# Patient Record
Sex: Female | Born: 2000 | Race: White | Hispanic: No | Marital: Single | Smoking: Never smoker
Health system: Southern US, Community
[De-identification: ages and names within clinical notes are randomized; demographics above are authoritative.]

## PROBLEM LIST (undated history)

## (undated) ENCOUNTER — Inpatient Hospital Stay (HOSPITAL_COMMUNITY): Payer: Self-pay

## (undated) ENCOUNTER — Inpatient Hospital Stay (HOSPITAL_COMMUNITY)

## (undated) DIAGNOSIS — F509 Eating disorder, unspecified: Principal | ICD-10-CM

## (undated) DIAGNOSIS — G43909 Migraine, unspecified, not intractable, without status migrainosus: Secondary | ICD-10-CM

## (undated) DIAGNOSIS — I10 Essential (primary) hypertension: Secondary | ICD-10-CM

## (undated) DIAGNOSIS — O139 Gestational [pregnancy-induced] hypertension without significant proteinuria, unspecified trimester: Secondary | ICD-10-CM

## (undated) DIAGNOSIS — F419 Anxiety disorder, unspecified: Secondary | ICD-10-CM

## (undated) HISTORY — PX: DENTAL SURGERY: SHX609

## (undated) HISTORY — DX: Gestational (pregnancy-induced) hypertension without significant proteinuria, unspecified trimester: O13.9

## (undated) HISTORY — DX: Eating disorder, unspecified: F50.9

## (undated) HISTORY — DX: Essential (primary) hypertension: I10

---
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
## 2019-01-02 ENCOUNTER — Telehealth: Payer: Self-pay | Admitting: Obstetrics & Gynecology

## 2019-01-02 NOTE — Telephone Encounter (Signed)
LVM to CB and reschedule missed appointment. °

## 2019-01-03 NOTE — Procedures (Signed)
Post-Placental IUD Insertion Procedure Note LATE ENTRY   Patient identified, informed consent signed prior to delivery, signed copy in chart, time out was performed.    Dr. Debroah Loop performed the insertion.   Vaginal, labial and perineal areas thoroughly inspected for lacerations. Superficial 1st degree laceration identified - not hemostatic,repaired  After IUD insertion.  Mirena/Liletta/Skyla/Paragard/Kyleena    - IUD inserted with inserter per manufacturer's instructions.    Strings trimmed to the level of the introitus. Patient tolerated procedure well.  The Lot Number: [id number]  Expiration Date 02/2022   Patient given post procedure instructions and IUD care card with expiration date.  [name] asked to keep IUD strings tucked in her vagina until her postpartum follow up visit in 4-6 weeks. Patient advised to abstain from sexual intercourse and pulling on strings before her follow-up visit. Patient verbalized an understanding of the plan of care and agrees.

## 2019-01-05 ENCOUNTER — Ambulatory Visit: Payer: Medicaid Other

## 2019-01-05 VITALS — BP 121/82 | HR 111 | Wt 123.4 lb

## 2019-01-05 DIAGNOSIS — Z0130 Encounter for examination of blood pressure without abnormal findings: Secondary | ICD-10-CM

## 2019-01-05 NOTE — Progress Notes (Signed)
Pt is here for PP BP check. She delivered on 12/22/2018. Pt is prescribed Amlodipine 5mg  daily for hypertension. BP today within normal limits and pt denies any symptoms. Advised by Steward Drone CNM to keep patient on current meds until her post partum appt on 01/19/19. Pt verbalized understanding.

## 2019-01-19 ENCOUNTER — Ambulatory Visit: Payer: Medicaid Other | Admitting: Obstetrics & Gynecology

## 2019-01-19 NOTE — Progress Notes (Deleted)
   Patient did not show up today for her scheduled appointment.   Vallen Calabrese, MD, FACOG Obstetrician & Gynecologist, [facility], [facility]  

## 2019-02-01 ENCOUNTER — Ambulatory Visit: Payer: Medicaid Other

## 2019-02-02 ENCOUNTER — Telehealth: Payer: Self-pay | Admitting: *Deleted

## 2019-02-02 NOTE — Telephone Encounter (Signed)
I called patient in regards to a missed appt no answer left voicemail.

## 2019-07-15 IMAGING — US US MFM OB DETAIL+14 WK
1 series · 14 of 28 positions shown · non-contrast
Comparison: none

[Series 1: us mfm ob detail+14 wk · 76 acquisitions, 14 frames shown]
[im 3/76]
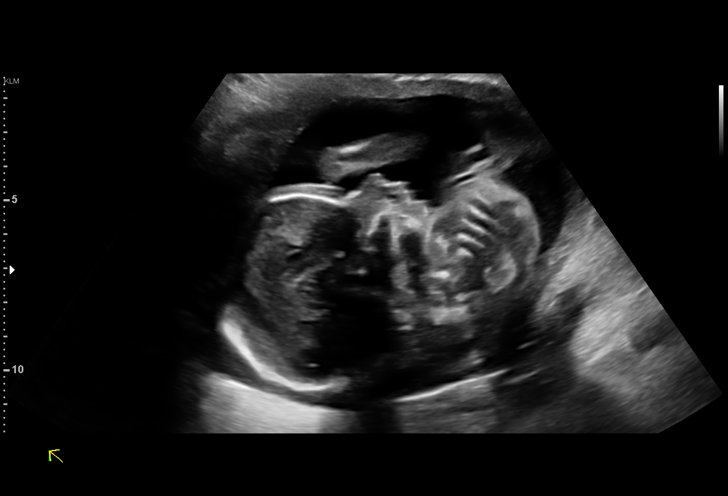
[im 9/76]
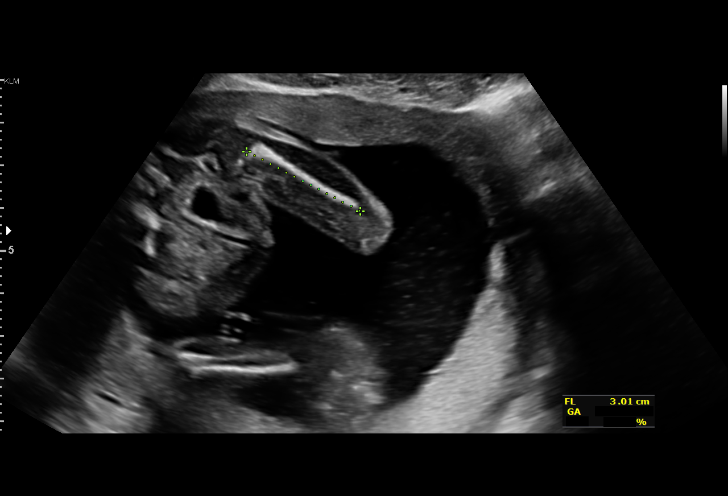
[im 14/76]
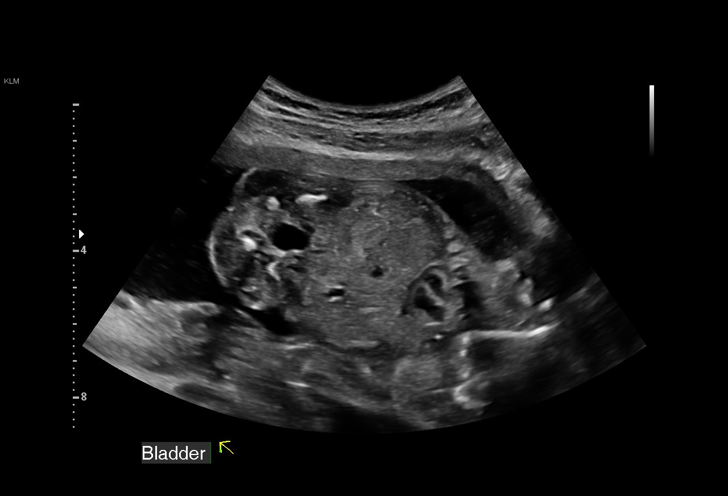
[im 20/76]
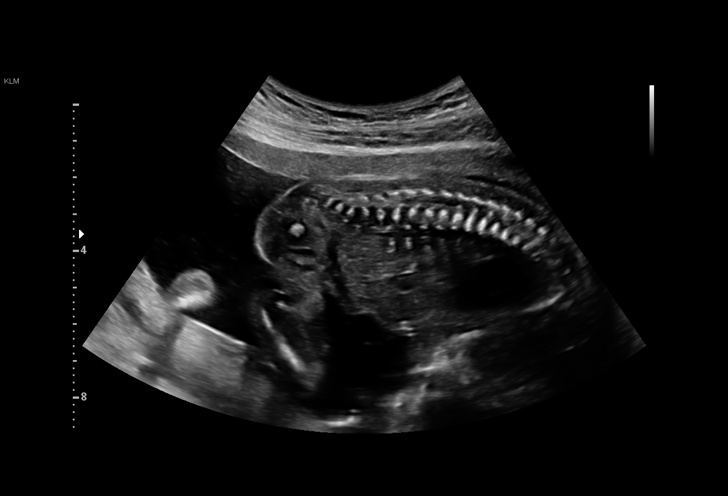
[im 26/76]
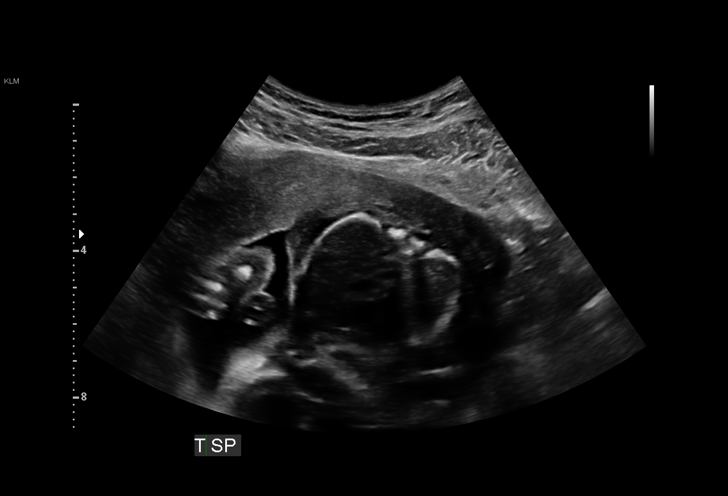
[im 31/76]
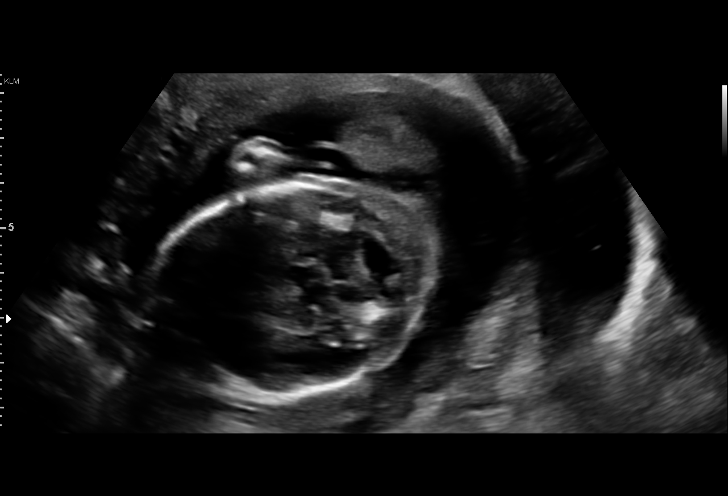
[im 37/76]
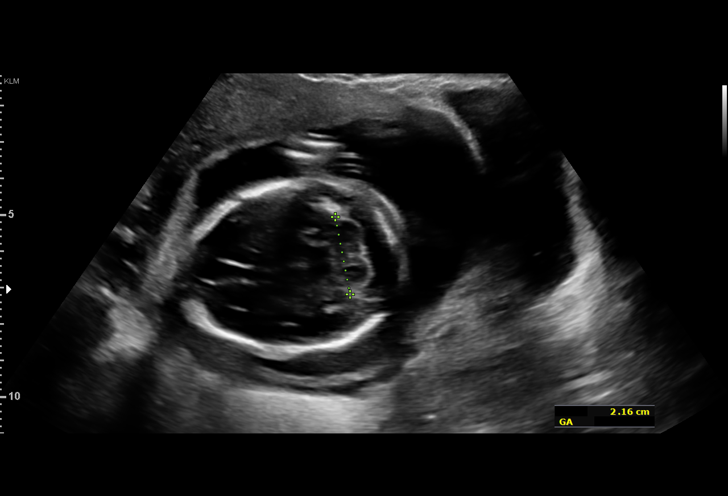
[im 42/76]
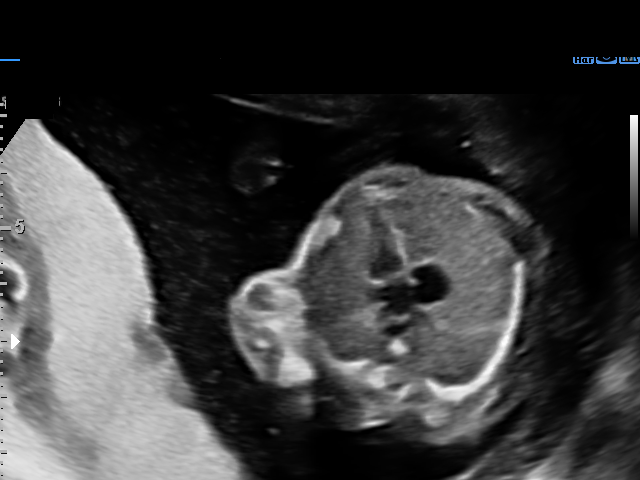
[im 48/76]
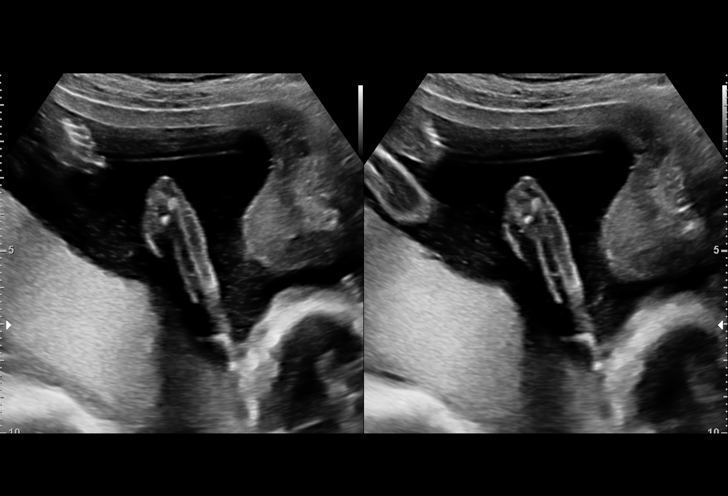
[im 53/76]
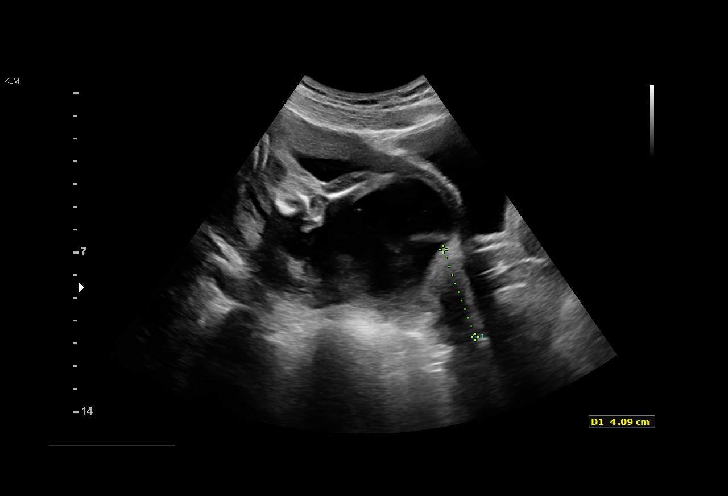
[im 59/76]
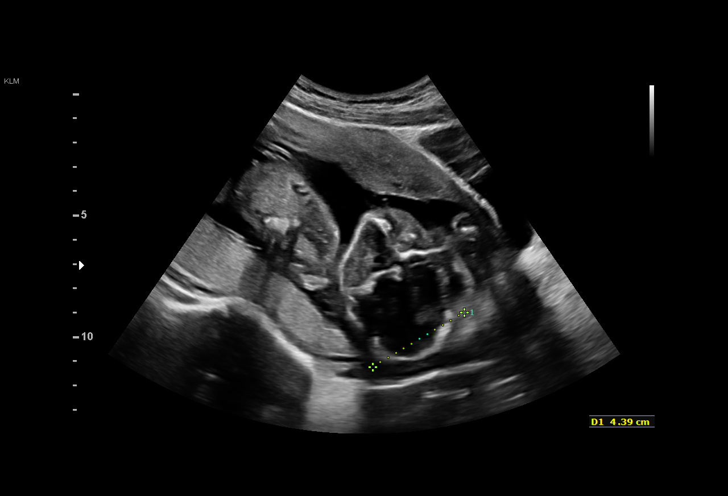
[im 64/76]
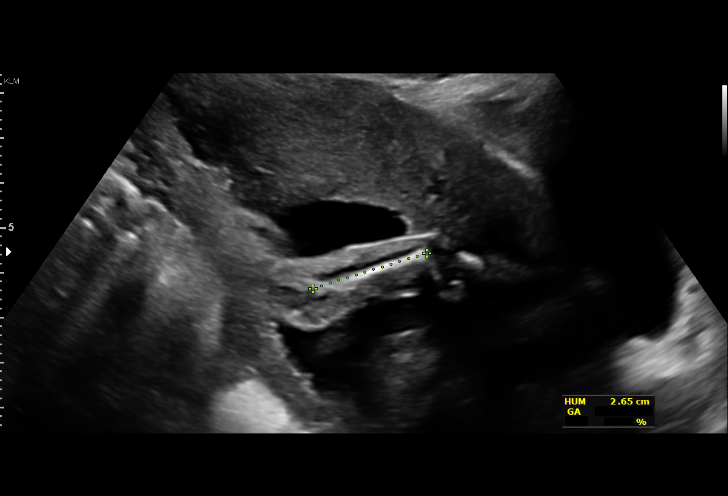
[im 70/76]
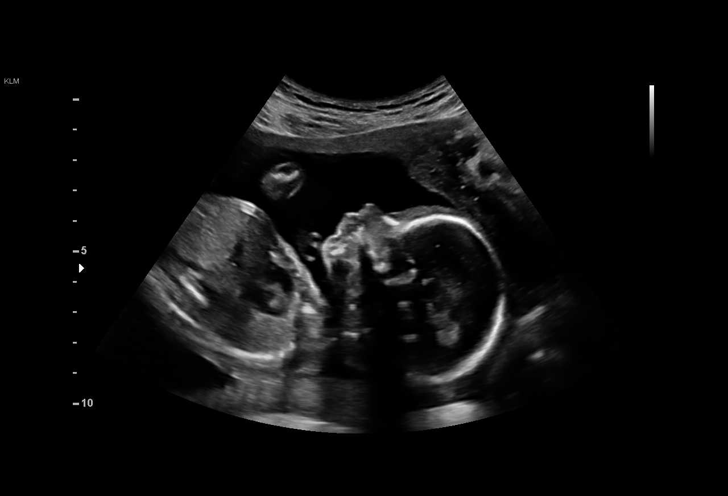
[im 76/76]
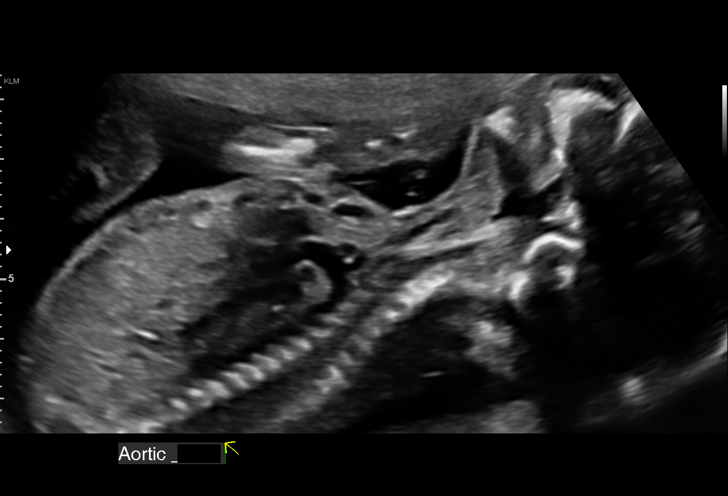

[14 of 28 positions shown; findings below may reference images not displayed]

Indications

Encounter for antenatal screening for
malformations (normal first trimester screen)
20 weeks gestation of pregnancy
Anxiety during pregnancy, second trimester     O99.342,
Disordered eating
Marijuana Abuse
Vital Signs

Height:        5'3"
Fetal Evaluation

Num Of Fetuses:         1
Fetal Heart Rate(bpm):  155
Cardiac Activity:       Observed
Presentation:           Cephalic
Placenta:               Posterior
P. Cord Insertion:      Visualized, central

Amniotic Fluid
AFI FV:      Within normal limits

Largest Pocket(cm)
3.2
Biometry

BPD:        46  mm     G. Age:  19w 6d         33  %    CI:        71.88   %    70 - 86
FL/HC:      17.4   %    16.8 -
HC:      172.7  mm     G. Age:  19w 6d         23  %    HC/AC:      1.03        1.09 -
AC:      167.3  mm     G. Age:  21w 5d         86  %    FL/BPD:     65.4   %
FL:       30.1  mm     G. Age:  19w 2d         13  %    FL/AC:      18.0   %    20 - 24
HUM:      27.8  mm     G. Age:  18w 6d         16  %
CER:      21.6  mm     G. Age:  20w 4d         55  %
Est. FW:     360  gm    0 lb 13 oz      52  %
OB History

Gravidity:    1
Gestational Age

LMP:           20w 2d        Date:  03/27/18                 EDD:   01/01/19
U/S Today:     20w 1d                                        EDD:   01/02/19
Best:          20w 2d     Det. By:  LMP  (03/27/18)          EDD:   01/01/19
Anatomy

Cranium:               Appears normal         Aortic Arch:            Appears normal
Cavum:                 Appears normal         Ductal Arch:            Appears normal
Ventricles:            Appears normal         Diaphragm:              Appears normal
Choroid Plexus:        Appears normal         Stomach:                Appears normal, left
sided
Cerebellum:            Appears normal         Abdomen:                Appears normal
Posterior Fossa:       Appears normal         Abdominal Wall:         Appears nml (cord
insert, abd wall)
Nuchal Fold:           Not applicable (>20    Cord Vessels:           Appears normal (3
wks GA)                                        vessel cord)
Face:                  Appears normal         Kidneys:                Appear normal
(orbits and profile)
Lips:                  Appears normal         Bladder:                Appears normal
Thoracic:              Appears normal         Spine:                  Appears normal
Heart:                 Appears normal         Upper Extremities:      Appears normal
(4CH, axis, and situs
RVOT:                  Appears normal         Lower Extremities:      Appears normal
LVOT:                  Appears normal

Other:  Fetus appears to be a male. Heels visualized. Nasal bone visualized.
Cervix Uterus Adnexa

Cervix
Length:            4.1  cm.
Normal appearance by transabdominal scan.
Comments

U/S images reviewed. Findings reviewed with patient.
Appropriate fetal growth is noted.  No fetal abnormalities are
seen.
Questions answered.
10 minutes spent face to face with patient.
Recommendations: 1) Follow-up as clinically indicated
Recommendations

Follow-up as clinically indicated

## 2019-10-07 DIAGNOSIS — I951 Orthostatic hypotension: Secondary | ICD-10-CM | POA: Diagnosis not present

## 2019-10-07 DIAGNOSIS — R5383 Other fatigue: Secondary | ICD-10-CM | POA: Diagnosis not present

## 2019-12-14 ENCOUNTER — Other Ambulatory Visit: Payer: Self-pay

## 2019-12-14 ENCOUNTER — Encounter (HOSPITAL_COMMUNITY): Payer: Self-pay | Admitting: Emergency Medicine

## 2019-12-14 ENCOUNTER — Emergency Department (HOSPITAL_COMMUNITY)
Admission: EM | Admit: 2019-12-14 | Discharge: 2019-12-15 | Discharge status: Against Medical Advice | Payer: Medicaid Other | Attending: Emergency Medicine | Admitting: Emergency Medicine

## 2019-12-14 DIAGNOSIS — Z79899 Other long term (current) drug therapy: Secondary | ICD-10-CM | POA: Insufficient documentation

## 2019-12-14 DIAGNOSIS — R1030 Lower abdominal pain, unspecified: Secondary | ICD-10-CM | POA: Diagnosis not present

## 2019-12-14 DIAGNOSIS — R1110 Vomiting, unspecified: Secondary | ICD-10-CM | POA: Diagnosis not present

## 2019-12-14 DIAGNOSIS — Z5320 Procedure and treatment not carried out because of patient's decision for unspecified reasons: Secondary | ICD-10-CM | POA: Insufficient documentation

## 2019-12-14 DIAGNOSIS — R1032 Left lower quadrant pain: Secondary | ICD-10-CM | POA: Diagnosis not present

## 2019-12-14 LAB — CBC
HCT: 40.1 % (ref 36.0–46.0)
Hemoglobin: 13.2 g/dL (ref 12.0–15.0)
MCH: 30.8 pg (ref 26.0–34.0)
MCHC: 32.9 g/dL (ref 30.0–36.0)
MCV: 93.5 fL (ref 80.0–100.0)
Platelets: 298 10*3/uL (ref 150–400)
RBC: 4.29 MIL/uL (ref 3.87–5.11)
RDW: 13.1 % (ref 11.5–15.5)
WBC: 10.8 10*3/uL — ABNORMAL HIGH (ref 4.0–10.5)
nRBC: 0 % (ref 0.0–0.2)

## 2019-12-14 LAB — COMPREHENSIVE METABOLIC PANEL
ALT: 18 U/L (ref 0–44)
AST: 21 U/L (ref 15–41)
Albumin: 4.1 g/dL (ref 3.5–5.0)
Alkaline Phosphatase: 95 U/L (ref 38–126)
Anion gap: 12 (ref 5–15)
BUN: 9 mg/dL (ref 6–20)
CO2: 21 mmol/L — ABNORMAL LOW (ref 22–32)
Calcium: 9 mg/dL (ref 8.9–10.3)
Chloride: 105 mmol/L (ref 98–111)
Creatinine, Ser: 0.8 mg/dL (ref 0.44–1.00)
GFR calc Af Amer: >60 mL/min (ref >60)
GFR calc non Af Amer: >60 mL/min (ref >60)
Glucose, Bld: 87 mg/dL (ref 70–99)
Potassium: 3.8 mmol/L (ref 3.5–5.1)
Sodium: 138 mmol/L (ref 135–145)
Total Bilirubin: 0.2 mg/dL — ABNORMAL LOW (ref 0.3–1.2)
Total Protein: 6.7 g/dL (ref 6.5–8.1)

## 2019-12-14 LAB — URINALYSIS, ROUTINE W REFLEX MICROSCOPIC
Bilirubin Urine: NEGATIVE
Glucose, UA: NEGATIVE mg/dL
Hgb urine dipstick: NEGATIVE
Ketones, ur: NEGATIVE mg/dL
Leukocytes,Ua: NEGATIVE
Nitrite: NEGATIVE
Protein, ur: NEGATIVE mg/dL
Specific Gravity, Urine: 1.025 (ref 1.005–1.030)
pH: 6 (ref 5.0–8.0)

## 2019-12-14 LAB — LIPASE, BLOOD: Lipase: 34 U/L (ref 11–51)

## 2019-12-14 LAB — I-STAT BETA HCG BLOOD, ED (MC, WL, AP ONLY): I-stat hCG, quantitative: <5 m[IU]/mL (ref <5)

## 2019-12-14 MED ORDER — SODIUM CHLORIDE 0.9% FLUSH: 3.0000 mL | Freq: Once | INTRAVENOUS | Status: DC

## 2019-12-14 NOTE — ED Triage Notes (Signed)
Patient reports intermittent hypogastric pain with nausea and emesis for several weeks , denies diarrhea or fever .

## 2019-12-15 ENCOUNTER — Emergency Department (HOSPITAL_COMMUNITY): Payer: Medicaid Other

## 2019-12-15 DIAGNOSIS — R1032 Left lower quadrant pain: Secondary | ICD-10-CM | POA: Diagnosis not present

## 2019-12-15 NOTE — ED Provider Notes (Signed)
[facility] EMERGENCY DEPARTMENT Provider Note   CSN: [id number] Arrival date & time: 12/14/19  2138     History Chief Complaint  Patient presents with  . Abdominal Pain    Stefanie King is a 19 y.o. female G1P1 with a hx of Aanxiety presents to the Emergency Department complaining of gradual, persistent, progressively worsening lower abd pain onset 1/22.  Pt reports pain is sharp, cramping, waxing and waning without radiation.  She reports previous episodes of pain in the last 6 mos that resolved on their own, however this episode has not resolved spontaneously. Pt reports she had an IUD placed approx 1 year ago after the birth of her child. Pt denies vaginal bleeding or vaginal discharge.  Pt reports she is sexually active with 1 female partner; no concern for STD.  LMP: 12/03/2018  Pt reports associated vomiting without diarrhea or weakness.  No urinary symptoms.  Pt denies fever, chills, headache, neck pain, chest pain, SOB, cough.  No known COVID contacts.    OB/GYN: Duaine Dredge [facility]   The history is provided by the patient and medical records. No language interpreter was used.       Past Medical History:  Diagnosis Date  . Anxiety   . Disordered eating 12/25/2015  . Migraines     Patient Active Problem List   Diagnosis Date Noted  . Encounter for induction of labor 12/22/2018  . High risk teen pregnancy 12/22/2018  . Gestational hypertension 12/21/2018  . Supervision of normal first pregnancy, antepartum 06/09/2018  . 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 Severe recurrent major depression without psychotic features (HCC) 03/11/2018  . Encounter for contraceptive management 10/21/2017  . Marijuana abuse 10/21/2017  . Adjustment disorder with mixed anxiety and depressed mood 01/13/2016  . Migraine without aura and without status migrainosus, not intractable 01/01/2016  . Episodic tension type headache  01/01/2016  . Disordered eating 12/25/2015  . Acne vulgaris 08/06/2015  . Vitamin D deficiency 09/25/2014    Past Surgical History:  Procedure Laterality Date  . CHOLECYSTECTOMY N/A 10/17/2014   Procedure: LAPAROSCOPIC CHOLECYSTECTOMY WITHOUT INTRAOPERATIVE CHOLANGIOGRAM;  Surgeon: Judie Petit. Leonia Corona, MD;  Location: [facility] OR;  Service: Pediatrics;  Laterality: N/A;  laparoscopic cholecystectomy  . CHOLECYSTECTOMY  2015  . DENTAL SURGERY       OB History    Gravida  1   Para  1   Term  1   Preterm      AB      Living  1     SAB      TAB      Ectopic      Multiple  0   Live Births  1           Family History  Problem Relation Age of Onset  . Alcohol abuse Father   . Cancer Paternal Grandmother   . Asthma Neg Hx   . Depression Neg Hx   . Diabetes Neg Hx   . Drug abuse Neg Hx   . Early death Neg Hx   . Hearing loss Neg Hx   . Heart disease Neg Hx   . Hyperlipidemia Neg Hx   . Hypertension Neg Hx   . Kidney disease Neg Hx   . Mental illness Neg Hx   . Mental retardation Neg Hx   . Stroke Neg Hx     Social History   Tobacco Use  . Smoking status: Never Smoker  .  Smokeless tobacco: Never Used  . Tobacco comment: last marijuana smoked 06-25-18  Substance Use Topics  . Alcohol use: No    Alcohol/week: 0.0 standard drinks  . Drug use: Not Currently    Types: Marijuana    Comment: stopped 06-25-18    Home Medications Prior to Admission medications   Medication Sig Start Date End Date Taking? Authorizing Provider  acetaminophen (TYLENOL) 325 MG tablet Take 2 tablets (650 mg total) by mouth every 4 (four) hours as needed (for pain scale < 4). 12/23/18   Kellogg, Alexandra L, DO  amLODipine (NORVASC) 5 MG tablet Take 1 tablet (5 mg total) by mouth daily. 12/24/18   Tamera Stands, DO  calcium carbonate (TUMS - DOSED IN MG ELEMENTAL CALCIUM) 500 MG chewable tablet Chew 1 tablet by mouth 2 (two) times daily as needed for indigestion or heartburn.     [provider]  ibuprofen (ADVIL,MOTRIN) 600 MG tablet Take 1 tablet (600 mg total) by mouth every 6 (six) hours. 12/23/18   Kellogg, Alexandra L, DO  metoCLOPramide (REGLAN) 10 MG tablet Take 10 mg by mouth at bedtime.    [provider]  Prenatal Vit-Fe Fumarate-FA (PRENATAL VITAMIN) 27-0.8 MG TABS Take 1 tablet by mouth daily. 05/04/18   Verneda Skill, FNP    Allergies    Patient has no known allergies.  Review of Systems   Review of Systems  Constitutional: Negative for appetite change, diaphoresis, fatigue, fever and unexpected weight change.  HENT: Negative for mouth sores.   Eyes: Negative for visual disturbance.  Respiratory: Negative for cough, chest tightness, shortness of breath and wheezing.   Cardiovascular: Negative for chest pain.  Gastrointestinal: Positive for abdominal pain. Negative for constipation, diarrhea, nausea and vomiting.  Endocrine: Negative for polydipsia, polyphagia and polyuria.  Genitourinary: Negative for dysuria, frequency, hematuria and urgency.  Musculoskeletal: Negative for back pain and neck stiffness.  Skin: Negative for rash.  Allergic/Immunologic: Negative for immunocompromised state.  Neurological: Negative for syncope, light-headedness and headaches.  Hematological: Does not bruise/bleed easily.  Psychiatric/Behavioral: Negative for sleep disturbance. The patient is not nervous/anxious.     Physical Exam Updated Vital Signs BP 98/61 (BP Location: Left Arm)   Pulse 86   Temp 98 F (36.7 C) (Oral)   Resp 14   LMP 12/07/2019   SpO2 97%   Physical Exam Vitals and nursing note reviewed.  Constitutional:      General: She is not in acute distress.    Appearance: She is not diaphoretic.  HENT:     Head: Normocephalic.  Eyes:     General: No scleral icterus.    Conjunctiva/sclera: Conjunctivae normal.  Cardiovascular:     Rate and Rhythm: Normal rate and regular rhythm.     Pulses: Normal pulses.           Radial pulses are 2+ on the right side and 2+ on the left side.  Pulmonary:     Effort: No tachypnea, accessory muscle usage, prolonged expiration, respiratory distress or retractions.     Breath sounds: No stridor.     Comments: Equal chest rise. No increased work of breathing. Abdominal:     General: There is no distension.     Palpations: Abdomen is soft.     Tenderness: There is no abdominal tenderness. There is no right CVA tenderness, left CVA tenderness, guarding or rebound.  Musculoskeletal:     Cervical back: Normal range of motion.     Comments: Moves all extremities  equally and without difficulty.  Skin:    General: Skin is warm and dry.     Capillary Refill: Capillary refill takes less than 2 seconds.  Neurological:     Mental Status: She is alert.     GCS: GCS eye subscore is 4. GCS verbal subscore is 5. GCS motor subscore is 6.     Comments: Speech is clear and goal oriented.  Psychiatric:        Mood and Affect: Mood normal.     ED Results / Procedures / Treatments   Labs (all labs ordered are listed, but only abnormal results are displayed) Labs Reviewed  COMPREHENSIVE METABOLIC PANEL - Abnormal; Notable for the following components:      Result Value   CO2 21 (*)    Total Bilirubin 0.2 (*)    All other components within normal limits  CBC - Abnormal; Notable for the following components:   WBC 10.8 (*)    All other components within normal limits  URINALYSIS, ROUTINE W REFLEX MICROSCOPIC - Abnormal; Notable for the following components:   APPearance HAZY (*)    All other components within normal limits  WET PREP, GENITAL  LIPASE, BLOOD  I-STAT BETA HCG BLOOD, ED ([facility], [facility], [facility] ONLY)  GC/CHLAMYDIA PROBE AMP ([place]) NOT AT [facility]    Radiology US Transvaginal Non-OB  Result Date: 12/15/2019 CLINICAL DATA:  Initial evaluation for acute left lower quadrant abdominal pain. EXAM: TRANSABDOMINAL AND TRANSVAGINAL ULTRASOUND OF PELVIS DOPPLER ULTRASOUND OF  OVARIES TECHNIQUE: Both transabdominal and transvaginal ultrasound examinations of the pelvis were performed. Transabdominal technique was performed for global imaging of the pelvis including uterus, ovaries, adnexal regions, and pelvic cul-de-sac. It was necessary to proceed with endovaginal exam following the transabdominal exam to visualize the uterus, endometrium, and ovaries. Color and duplex Doppler ultrasound was utilized to evaluate blood flow to the ovaries. COMPARISON:  None available. FINDINGS: Uterus Measurements: 8.0 x 3.6 x 4.3 cm = volume: 63.7 mL. No fibroids or other mass visualized. Endometrium Thickness: 2 mm. No focal abnormality visualized. IUD in appropriate position within the endometrial cavity. Right ovary Measurements: 2.8 x 1.3 x 2.8 cm = volume: 5.3 mL. Normal appearance/no adnexal mass. Left ovary Measurements: 3.8 x 1.6 x 3.6 cm = volume: 11.2 mL. Normal appearance/no adnexal mass. Pulsed Doppler evaluation of both ovaries demonstrates normal low-resistance arterial and venous waveforms. Other findings Trace free physiologic fluid present within the pelvis. IMPRESSION: 1. Normal pelvic ultrasound. No evidence for torsion or other acute abnormality. 2. IUD in appropriate position within the endometrial cavity. Electronically Signed   By: Rise Mu M.D.   On: 12/15/2019 02:15   US Pelvis Complete  Result Date: 12/15/2019 CLINICAL DATA:  Initial evaluation for acute left lower quadrant abdominal pain. EXAM: TRANSABDOMINAL AND TRANSVAGINAL ULTRASOUND OF PELVIS DOPPLER ULTRASOUND OF OVARIES TECHNIQUE: Both transabdominal and transvaginal ultrasound examinations of the pelvis were performed. Transabdominal technique was performed for global imaging of the pelvis including uterus, ovaries, adnexal regions, and pelvic cul-de-sac. It was necessary to proceed with endovaginal exam following the transabdominal exam to visualize the uterus, endometrium, and ovaries. Color and duplex  Doppler ultrasound was utilized to evaluate blood flow to the ovaries. COMPARISON:  None available. FINDINGS: Uterus Measurements: 8.0 x 3.6 x 4.3 cm = volume: 63.7 mL. No fibroids or other mass visualized. Endometrium Thickness: 2 mm. No focal abnormality visualized. IUD in appropriate position within the endometrial cavity. Right ovary Measurements: 2.8 x 1.3 x 2.8 cm =  volume: 5.3 mL. Normal appearance/no adnexal mass. Left ovary Measurements: 3.8 x 1.6 x 3.6 cm = volume: 11.2 mL. Normal appearance/no adnexal mass. Pulsed Doppler evaluation of both ovaries demonstrates normal low-resistance arterial and venous waveforms. Other findings Trace free physiologic fluid present within the pelvis. IMPRESSION: 1. Normal pelvic ultrasound. No evidence for torsion or other acute abnormality. 2. IUD in appropriate position within the endometrial cavity. Electronically Signed   By: Benjamin  MRise MucClintock M.D.   On: 12/15/2019 02:15   US Art/Ven Flow Abd Pelv Doppler  Result Date: 12/15/2019 CLINICAL DATA:  Initial evaluation for acute left lower quadrant abdominal pain. EXAM: TRANSABDOMINAL AND TRANSVAGINAL ULTRASOUND OF PELVIS DOPPLER ULTRASOUND OF OVARIES TECHNIQUE: Both transabdominal and transvaginal ultrasound examinations of the pelvis were performed. Transabdominal technique was performed for global imaging of the pelvis including uterus, ovaries, adnexal regions, and pelvic cul-de-sac. It was necessary to proceed with endovaginal exam following the transabdominal exam to visualize the uterus, endometrium, and ovaries. Color and duplex Doppler ultrasound was utilized to evaluate blood flow to the ovaries. COMPARISON:  None available. FINDINGS: Uterus Measurements: 8.0 x 3.6 x 4.3 cm = volume: 63.7 mL. No fibroids or other mass visualized. Endometrium Thickness: 2 mm. No focal abnormality visualized. IUD in appropriate position within the endometrial cavity. Right ovary Measurements: 2.8 x 1.3 x 2.8 cm = volume: 5.3  mL. Normal appearance/no adnexal mass. Left ovary Measurements: 3.8 x 1.6 x 3.6 cm = volume: 11.2 mL. Normal appearance/no adnexal mass. Pulsed Doppler evaluation of both ovaries demonstrates normal low-resistance arterial and venous waveforms. Other findings Trace free physiologic fluid present within the pelvis. IMPRESSION: 1. Normal pelvic ultrasound. No evidence for torsion or other acute abnormality. 2. IUD in appropriate position within the endometrial cavity. Electronically Signed   By: Rise MuBenjamin  McClintock M.D.   On: 12/15/2019 02:15    Procedures Procedures (including critical care time)  Medications Ordered in ED Medications  sodium chloride flush (NS) 0.9 % injection 3 mL (has no administration in time range)    ED Course  I have reviewed the triage vital signs and the nursing notes.  Pertinent labs & imaging results that were available during my care of the patient were reviewed by me and considered in my medical decision making (see chart for details).    MDM Rules/Calculators/A&P                      Patient presents to the emergency department for persistent lower abdominal pain for 1 week.  Patient does have an IUD in place.  She denies all vaginal or urinary symptoms.  On exam her abdomen is soft and nontender.  No rebound or guarding.  Labs are reassuring.  Very mild leukocytosis of 10.8.  No evidence of urinary tract infection.  Will obtain ultrasound to assess for potential ovarian cyst, IUD displacement or ovarian torsion.  Patient reports she is in a monogamous relationship and is in not concerned about STDs however will perform pelvic exam.  3:21 AM Ultrasound shows IUD is correctly placed, no ovarian torsion or ovarian cyst.  She eloped from the department before I was able to give her the results of her ultrasound or perform her pelvic exam.  Patient vital signs remained stable throughout her time here in the emergency department and no emergent conditions were found  on the work-up that I was able to complete before her elopement.  BP 98/61 (BP Location: Left Arm)   Pulse 69   Temp  98 F (36.7 C) (Oral)   Resp 14   LMP 12/07/2019   SpO2 100%     Final Clinical Impression(s) / ED Diagnoses Final diagnoses:  Lower abdominal pain    Rx / DC Orders ED Discharge Orders    None       Saia Derossett, Gwenlyn Perking 12/15/19 0323    Ripley Fraise, MD 12/15/19 [phone]

## 2019-12-15 NOTE — ED Notes (Signed)
Pt requests to leave AMA. Pt reports that her visit is taking too long and she must get home to her child. Pt education provided on the risks of leaving prior to all the tests being completed. Pt verbalizes understanding and requests to leave AMA. Pt is alert and oriented x4. Pt ambulated to the lobby.

## 2019-12-15 NOTE — ED Notes (Signed)
Pt transported to ultrasound.

## 2020-03-04 ENCOUNTER — Ambulatory Visit: Payer: Medicaid Other | Admitting: Obstetrics

## 2020-03-15 ENCOUNTER — Ambulatory Visit: Payer: Medicaid Other | Admitting: Obstetrics

## 2020-04-01 ENCOUNTER — Other Ambulatory Visit: Payer: Self-pay

## 2020-04-01 ENCOUNTER — Ambulatory Visit (INDEPENDENT_AMBULATORY_CARE_PROVIDER_SITE_OTHER): Payer: Medicaid Other | Admitting: Obstetrics

## 2020-04-01 ENCOUNTER — Encounter: Payer: Self-pay | Admitting: Obstetrics

## 2020-04-01 VITALS — BP 118/78 | HR 65 | Wt 111.0 lb

## 2020-04-01 DIAGNOSIS — Z30432 Encounter for removal of intrauterine contraceptive device: Secondary | ICD-10-CM | POA: Diagnosis not present

## 2020-04-01 NOTE — Progress Notes (Signed)
    GYNECOLOGY OFFICE PROCEDURE NOTE  Stefanie King is a 19 y.o. G2P1001 here for Liletta IUD removal. No GYN concerns.   IUD Removal  Patient identified, informed consent performed, consent signed.  Patient was in the dorsal lithotomy position, normal external genitalia was noted.  A speculum was placed in the patient's vagina, normal discharge was noted, no lesions. The cervix was visualized, no lesions, no abnormal discharge.  The strings of the IUD were grasped and pulled using ring forceps. The IUD was removed in its entirety.  Patient tolerated the procedure well.    Patient will use nothing for contraception.  She plans for pregnancy soon and she was told to avoid teratogens, take PNV and folic acid.  Routine preventative health maintenance measures emphasized.   Brock Bad, MD, FACOG Obstetrician & Gynecologist, [facility], [facility] 04/01/2020

## 2020-08-09 DIAGNOSIS — U071 COVID-19: Secondary | ICD-10-CM | POA: Diagnosis not present

## 2020-08-09 DIAGNOSIS — R05 Cough: Secondary | ICD-10-CM | POA: Diagnosis not present

## 2020-08-12 DIAGNOSIS — R05 Cough: Secondary | ICD-10-CM | POA: Diagnosis not present

## 2020-08-12 DIAGNOSIS — U071 COVID-19: Secondary | ICD-10-CM | POA: Diagnosis not present

## 2020-09-22 ENCOUNTER — Other Ambulatory Visit: Payer: Self-pay

## 2020-09-22 DIAGNOSIS — R1013 Epigastric pain: Secondary | ICD-10-CM | POA: Diagnosis not present

## 2020-09-22 DIAGNOSIS — O26891 Other specified pregnancy related conditions, first trimester: Secondary | ICD-10-CM | POA: Diagnosis present

## 2020-09-22 DIAGNOSIS — Z3A01 Less than 8 weeks gestation of pregnancy: Secondary | ICD-10-CM | POA: Insufficient documentation

## 2020-09-22 DIAGNOSIS — O26899 Other specified pregnancy related conditions, unspecified trimester: Secondary | ICD-10-CM | POA: Diagnosis not present

## 2020-09-22 DIAGNOSIS — Z79899 Other long term (current) drug therapy: Secondary | ICD-10-CM | POA: Diagnosis not present

## 2020-09-23 ENCOUNTER — Emergency Department (HOSPITAL_COMMUNITY)
Admission: EM | Admit: 2020-09-23 | Discharge: 2020-09-23 | Disposition: A | Discharge status: Home / Self Care | Payer: Medicaid Other | Attending: Emergency Medicine | Admitting: Emergency Medicine

## 2020-09-23 ENCOUNTER — Other Ambulatory Visit: Payer: Self-pay

## 2020-09-23 ENCOUNTER — Encounter (HOSPITAL_COMMUNITY): Payer: Self-pay | Admitting: Emergency Medicine

## 2020-09-23 DIAGNOSIS — R1013 Epigastric pain: Secondary | ICD-10-CM

## 2020-09-23 DIAGNOSIS — Z3A01 Less than 8 weeks gestation of pregnancy: Secondary | ICD-10-CM

## 2020-09-23 NOTE — Discharge Instructions (Addendum)
When you have these attacks try taking some Tums or other antacids to see if that helps with your discomfort.  Please discuss this with your OB doctor.  Return to the emergency department if you get vaginal bleeding.

## 2020-09-23 NOTE — ED Provider Notes (Signed)
[facility] EMERGENCY DEPARTMENT Provider Note   CSN: [id number] Arrival date & time: 09/22/20  2355   Time seen 12:45 AM  History Chief Complaint  Patient presents with  . Abdominal Pain    Stefanie King is a 19 y.o. female.  HPI   [name] P to G1 Ab0, last normal period September 21.  She states she is being followed at [facility] for [facility]'s [facility].  She has not had a ultrasound yet and has not made her next appointment yet.  She is not taking prenatal vitamins yet.  She came tonight because she has been having epigastric abdominal pain that started after she delivered her son in February 2020.  It is epigastric and describes it as "a gallbladder attack on steroids".  Usually wakes her up in the morning and she describes it as sharp and aching and it last about 10 minutes.  She states she cries herself back to sleep and it goes away.  She also describes some acid reflux symptoms.  She is not taking any medications.  Since she found out she is pregnant she is having nausea and vomiting once or twice a day mainly in the morning.  She does not associate it with any foods.  She is status post cholecystectomy.  Patient states the last time she had this pain was the morning of November 6.  She states she came in tonight because is been going on a long time and she is too busy to follow-up in the office with her primary care doctor.  She denies any vaginal bleeding.  She states sometimes she gets some abdominal cramping since she has been pregnant.   PCP [name], MD (Inactive)   Past Medical History:  Diagnosis Date  . Anxiety   . Disordered eating 12/25/2015  . Migraines     Patient Active Problem List   Diagnosis Date Noted  . Encounter for induction of labor 12/22/2018  . High risk teen pregnancy 12/22/2018  . Gestational hypertension 12/21/2018  . Supervision of normal first pregnancy, antepartum 06/09/2018  . Substance abuse affecting pregnancy in first trimester,  antepartum 06/05/2018  . Severe recurrent major depression without psychotic features (HCC) 03/11/2018  . Encounter for contraceptive management 10/21/2017  . Marijuana abuse 10/21/2017  . Adjustment disorder with mixed anxiety and depressed mood 01/13/2016  . Migraine without aura and without status migrainosus, not intractable 01/01/2016  . Episodic tension type headache 01/01/2016  . Disordered eating 12/25/2015  . Acne vulgaris 08/06/2015  . Vitamin D deficiency 09/25/2014    Past Surgical History:  Procedure Laterality Date  . CHOLECYSTECTOMY N/A 10/17/2014   Procedure: LAPAROSCOPIC CHOLECYSTECTOMY WITHOUT INTRAOPERATIVE CHOLANGIOGRAM;  Surgeon: Judie Petit. Leonia Corona, MD;  Location: [facility] OR;  Service: Pediatrics;  Laterality: N/A;  laparoscopic cholecystectomy  . CHOLECYSTECTOMY  2015  . DENTAL SURGERY       OB History    Gravida  2   Para  1   Term  1   Preterm      AB      Living  1     SAB      TAB      Ectopic      Multiple  0   Live Births  1           Family History  Problem Relation Age of Onset  . Alcohol abuse Father   . Cancer Paternal Grandmother   . Asthma Neg Hx   . Depression Neg Hx   .  Diabetes Neg Hx   . Drug abuse Neg Hx   . Early death Neg Hx   . Hearing loss Neg Hx   . Heart disease Neg Hx   . Hyperlipidemia Neg Hx   . Hypertension Neg Hx   . Kidney disease Neg Hx   . Mental illness Neg Hx   . Mental retardation Neg Hx   . Stroke Neg Hx     Social History   Tobacco Use  . Smoking status: Never Smoker  . Smokeless tobacco: Never Used  . Tobacco comment: last marijuana smoked 06-25-18  Vaping Use  . Vaping Use: Never used  Substance Use Topics  . Alcohol use: No    Alcohol/week: 0.0 standard drinks  . Drug use: Not Currently    Types: Marijuana    Comment: stopped 06-25-18    Home Medications Prior to Admission medications   Medication Sig Start Date End Date Taking? Authorizing Provider  acetaminophen (TYLENOL) 325  MG tablet Take 2 tablets (650 mg total) by mouth every 4 (four) hours as needed (for pain scale < 4). 12/23/18   Kellogg, Alexandra L, DO  amLODipine (NORVASC) 5 MG tablet Take 1 tablet (5 mg total) by mouth daily. 12/24/18   Tamera Stands, DO  calcium carbonate (TUMS - DOSED IN MG ELEMENTAL CALCIUM) 500 MG chewable tablet Chew 1 tablet by mouth 2 (two) times daily as needed for indigestion or heartburn.    [provider]  ibuprofen (ADVIL,MOTRIN) 600 MG tablet Take 1 tablet (600 mg total) by mouth every 6 (six) hours. 12/23/18   Kellogg, Alexandra L, DO  metoCLOPramide (REGLAN) 10 MG tablet Take 10 mg by mouth at bedtime.    [provider]  Prenatal Vit-Fe Fumarate-FA (PRENATAL VITAMIN) 27-0.8 MG TABS Take 1 tablet by mouth daily. 05/04/18   Verneda Skill, FNP    Allergies    Patient has no known allergies.  Review of Systems   Review of Systems  All other systems reviewed and are negative.   Physical Exam Updated Vital Signs BP 92/63   Pulse 64   Temp 98.2 F (36.8 C)   Resp 16   Ht 5\' 3"  (1.6 m)   Wt 49.9 kg   LMP 08/07/2020   SpO2 99%   Breastfeeding Yes   BMI 19.49 kg/m   Physical Exam Vitals and nursing note reviewed.  Constitutional:      General: She is not in acute distress.    Appearance: Normal appearance. She is normal weight.  HENT:     Head: Normocephalic and atraumatic.     Right Ear: External ear normal.     Left Ear: External ear normal.  Eyes:     Extraocular Movements: Extraocular movements intact.     Conjunctiva/sclera: Conjunctivae normal.     Pupils: Pupils are equal, round, and reactive to light.  Cardiovascular:     Rate and Rhythm: Normal rate and regular rhythm.     Pulses: Normal pulses.     Heart sounds: Normal heart sounds.  Pulmonary:     Effort: Pulmonary effort is normal. No respiratory distress.     Breath sounds: Normal breath sounds.  Abdominal:     General: Abdomen is flat. Bowel sounds are normal.      Palpations: Abdomen is soft.     Tenderness: There is no abdominal tenderness.  Musculoskeletal:        General: Normal range of motion.     Cervical back: Normal range  of motion.  Skin:    General: Skin is warm and dry.  Neurological:     General: No focal deficit present.     Mental Status: She is alert and oriented to person, place, and time.     Cranial Nerves: No cranial nerve deficit.  Psychiatric:        Mood and Affect: Mood normal.        Behavior: Behavior normal.        Thought Content: Thought content normal.     ED Results / Procedures / Treatments   Labs (all labs ordered are listed, but only abnormal results are displayed) Labs Reviewed - No data to display  EKG None  Radiology No results found.  Procedures Procedures (including critical care time)  Medications Ordered in ED Medications - No data to display  ED Course  I have reviewed the triage vital signs and the nursing notes.  Pertinent labs & imaging results that were available during my care of the patient were reviewed by me and considered in my medical decision making (see chart for details).    MDM Rules/Calculators/A&P                          Patient presents with epigastric abdominal pain that wakes her up at night, it has been almost 48 hours since she had her last episode.  She is status post cholecystectomy.  At this point I do not feel like any further testing needs to be done in the ED.  She was advised to discuss this with her OB doctor.  She also has some acid reflux symptoms and I am wondering if this may be more of a hiatal hernia type pain related to that.  She was advised to get Tums and take that for her discomfort.  She states she had hypertension with pregnancy and she was treated with Norvasc.  However she has not taken it since she delivered her baby.  She states she has blood pressure problems anyway off and on however tonight her blood pressures are within normal or slightly  low.  [name] not sure if she is going to follow-up at the same office for pregnancy.  He was advised to start taking her prenatal vitamins and follow-up soon.    Final Clinical Impression(s) / ED Diagnoses Final diagnoses:  None    Rx / DC Orders ED Discharge Orders    None    OTC TUMS  Plan discharge  Devoria Albe, MD, Concha Pyo, MD 09/23/20 [phone]

## 2020-09-23 NOTE — ED Triage Notes (Signed)
Pt c/o epigastric pain since February 2020. States it is off and on. Also c/o cramping in lower abdomen since she found she was pregnant x 7 weeks ago.

## 2020-09-26 ENCOUNTER — Telehealth: Payer: Self-pay

## 2020-09-26 NOTE — Telephone Encounter (Signed)
Transition Care Management Unsuccessful Follow-up Telephone Call  Date of discharge and from where:  09/23/20 [facility]  Attempts:  1st Attempt  Reason for unsuccessful TCM follow-up call:  Left voice message

## 2020-09-27 ENCOUNTER — Other Ambulatory Visit: Payer: Self-pay

## 2020-09-27 ENCOUNTER — Inpatient Hospital Stay (HOSPITAL_COMMUNITY): Payer: Medicaid Other

## 2020-09-27 ENCOUNTER — Inpatient Hospital Stay (HOSPITAL_COMMUNITY)
Admission: AD | Admit: 2020-09-27 | Discharge: 2020-09-28 | Disposition: A | Discharge status: Home / Self Care | Payer: Medicaid Other | Attending: Obstetrics and Gynecology | Admitting: Obstetrics and Gynecology

## 2020-09-27 ENCOUNTER — Encounter (HOSPITAL_COMMUNITY): Payer: Self-pay | Admitting: Obstetrics and Gynecology

## 2020-09-27 DIAGNOSIS — R1013 Epigastric pain: Secondary | ICD-10-CM | POA: Insufficient documentation

## 2020-09-27 DIAGNOSIS — R0789 Other chest pain: Secondary | ICD-10-CM

## 2020-09-27 DIAGNOSIS — O99891 Other specified diseases and conditions complicating pregnancy: Secondary | ICD-10-CM

## 2020-09-27 DIAGNOSIS — R102 Pelvic and perineal pain: Secondary | ICD-10-CM | POA: Diagnosis not present

## 2020-09-27 DIAGNOSIS — Z3491 Encounter for supervision of normal pregnancy, unspecified, first trimester: Secondary | ICD-10-CM

## 2020-09-27 DIAGNOSIS — O208 Other hemorrhage in early pregnancy: Secondary | ICD-10-CM | POA: Diagnosis not present

## 2020-09-27 DIAGNOSIS — O210 Mild hyperemesis gravidarum: Secondary | ICD-10-CM | POA: Insufficient documentation

## 2020-09-27 DIAGNOSIS — O26891 Other specified pregnancy related conditions, first trimester: Secondary | ICD-10-CM | POA: Diagnosis not present

## 2020-09-27 DIAGNOSIS — R109 Unspecified abdominal pain: Secondary | ICD-10-CM

## 2020-09-27 DIAGNOSIS — Z3A01 Less than 8 weeks gestation of pregnancy: Secondary | ICD-10-CM | POA: Diagnosis not present

## 2020-09-27 LAB — ABO/RH: ABO/RH(D): A POS

## 2020-09-27 LAB — URINALYSIS, ROUTINE W REFLEX MICROSCOPIC
Bilirubin Urine: NEGATIVE
Glucose, UA: NEGATIVE mg/dL
Hgb urine dipstick: NEGATIVE
Ketones, ur: NEGATIVE mg/dL
Nitrite: NEGATIVE
Protein, ur: NEGATIVE mg/dL
Specific Gravity, Urine: 1.016 (ref 1.005–1.030)
pH: 6 (ref 5.0–8.0)

## 2020-09-27 LAB — CBC
HCT: 34.9 % — ABNORMAL LOW (ref 36.0–46.0)
Hemoglobin: 12.1 g/dL (ref 12.0–15.0)
MCH: 31.2 pg (ref 26.0–34.0)
MCHC: 34.7 g/dL (ref 30.0–36.0)
MCV: 89.9 fL (ref 80.0–100.0)
Platelets: 270 10*3/uL (ref 150–400)
RBC: 3.88 MIL/uL (ref 3.87–5.11)
RDW: 11.8 % (ref 11.5–15.5)
WBC: 11.1 10*3/uL — ABNORMAL HIGH (ref 4.0–10.5)
nRBC: 0 % (ref 0.0–0.2)

## 2020-09-27 LAB — POCT PREGNANCY, URINE: Preg Test, Ur: POSITIVE — AB

## 2020-09-27 NOTE — Telephone Encounter (Signed)
Transition Care Management Unsuccessful Follow-up Telephone Call  Date of discharge and from where:  09/23/20 [facility]   Attempts:  2nd Attempt  Reason for unsuccessful TCM follow-up call:  Left voice message

## 2020-09-27 NOTE — MAU Note (Signed)
Patient reports she's been having pain in her ribcage that comes and goes.  Started having abdominal cramping a week ago that occurs intermittently.  Recently found out she was pregnant by HPT.  LMP 9/21.  Denies any VB.  States she hasn't taken anything for her pain.

## 2020-09-27 NOTE — MAU Provider Note (Addendum)
History    Chief Complaint  Patient presents with  . Abdominal Pain   Ms. Cosey presents with CC of daily episodes of sharp epigastric pain for the last month and also intermittent suprapubic cramping x3-4 days.   Sharp epigastric pain Was seen in the ED four days ago for the same problem. Began having these episodes after the birth of her first child 12/2018. At that time, was "once in a blue moon". In the last year, these episodes have become more frequent. For the last month or so, she now experiences them daily upon waking for 10 minutes.  Can often wake her up from sleep.  This pain does not radiate.  Associated with heart pounding and perceived palpitations, shortness of breath. Has not tried any medications for this, as she can do "is roll around in bed and cry until it goes away".  No aggravating or relieving factors.  No discernible patterns in terms of sleeping position, time of day, previous food eaten.  Remote history of gallstones s/p cholecystectomy in 2015.  Has experienced anxiety attacks before and reports this is "nothing at all like those".  Suprapubic cramping Reports 3-4 days of intermittent suprapubic cramping that "goes to my sides". Believes she is currently pregnant 2/2 positive home pregnancy test and reliable LMP. Early enough gestation that she has not yet established OB care or had an ultrasound to confirm IUP. No vaginal bleeding, discharge, fluid leakage. No passage of clots. No urinary symptoms, including no dysuria, pyuria.    OB History    Gravida  2   Para  1   Term  1   Preterm      AB      Living  1     SAB      TAB      Ectopic      Multiple  0   Live Births  1           Past Medical History:  Diagnosis Date  . Anxiety   . Disordered eating 12/25/2015  . Migraines     Past Surgical History:  Procedure Laterality Date  . CHOLECYSTECTOMY N/A 10/17/2014   Procedure: LAPAROSCOPIC CHOLECYSTECTOMY WITHOUT INTRAOPERATIVE  CHOLANGIOGRAM;  Surgeon: Judie Petit. Leonia Corona, MD;  Location: [facility] OR;  Service: Pediatrics;  Laterality: N/A;  laparoscopic cholecystectomy  . CHOLECYSTECTOMY  2015  . DENTAL SURGERY      Family History  Problem Relation Age of Onset  . Alcohol abuse Father   . Cancer Paternal Grandmother   . Asthma Neg Hx   . Depression Neg Hx   . Diabetes Neg Hx   . Drug abuse Neg Hx   . Early death Neg Hx   . Hearing loss Neg Hx   . Heart disease Neg Hx   . Hyperlipidemia Neg Hx   . Hypertension Neg Hx   . Kidney disease Neg Hx   . Mental illness Neg Hx   . Mental retardation Neg Hx   . Stroke Neg Hx     Social History   Tobacco Use  . Smoking status: Never Smoker  . Smokeless tobacco: Never Used  . Tobacco comment: last marijuana smoked 06-25-18  Vaping Use  . Vaping Use: Never used  Substance Use Topics  . Alcohol use: No    Alcohol/week: 0.0 standard drinks  . Drug use: Not Currently    Types: Marijuana    Comment: stopped 06-25-18    Allergies: No Known Allergies  Medications Prior to  Admission  Medication Sig Dispense Refill Last Dose  . ondansetron (ZOFRAN) 4 MG tablet Take 4 mg by mouth every 8 (eight) hours as needed for nausea or vomiting.   09/27/2020 at Unknown time  . acetaminophen (TYLENOL) 325 MG tablet Take 2 tablets (650 mg total) by mouth every 4 (four) hours as needed (for pain scale < 4). 30 tablet 0   . amLODipine (NORVASC) 5 MG tablet Take 1 tablet (5 mg total) by mouth daily. 30 tablet 0   . calcium carbonate (TUMS - DOSED IN MG ELEMENTAL CALCIUM) 500 MG chewable tablet Chew 1 tablet by mouth 2 (two) times daily as needed for indigestion or heartburn.     Marland Kitchen ibuprofen (ADVIL,MOTRIN) 600 MG tablet Take 1 tablet (600 mg total) by mouth every 6 (six) hours. 30 tablet 0   . metoCLOPramide (REGLAN) 10 MG tablet Take 10 mg by mouth at bedtime.     . Prenatal Vit-Fe Fumarate-FA (PRENATAL VITAMIN) 27-0.8 MG TABS Take 1 tablet by mouth daily. 90 tablet 3     Review of  Systems  Constitutional: Negative for chills, fatigue and fever.  Respiratory: Positive for shortness of breath. Negative for cough and chest tightness.   Cardiovascular: Positive for chest pain and palpitations.  Gastrointestinal: Positive for abdominal pain.  Genitourinary: Negative for difficulty urinating, dysuria, flank pain, hematuria, pelvic pain, vaginal bleeding, vaginal discharge and vaginal pain.   Physical Exam Blood pressure 118/73, pulse 83, temperature 98.8 F (37.1 C), resp. rate 15, weight 50.4 kg, last menstrual period 08/06/2020, currently breastfeeding. Physical Exam Constitutional:      General: She is not in acute distress.    Appearance: She is normal weight. She is not ill-appearing, toxic-appearing or diaphoretic.  HENT:     Head: Normocephalic.  Cardiovascular:     Rate and Rhythm: Normal rate and regular rhythm.     Heart sounds: Normal heart sounds. No murmur heard.   Pulmonary:     Effort: Pulmonary effort is normal. No respiratory distress.     Breath sounds: Normal breath sounds. No wheezing.  Chest:     Chest wall: No tenderness.  Abdominal:     General: Abdomen is flat. Bowel sounds are normal. There is no distension.     Palpations: Abdomen is soft. There is no hepatomegaly, splenomegaly, mass or pulsatile mass.     Tenderness: There is no abdominal tenderness. Negative signs include Murphy's sign and McBurney's sign.  Skin:    General: Skin is warm and dry.     Capillary Refill: Capillary refill takes less than 2 seconds.  Neurological:     General: No focal deficit present.     Mental Status: She is alert.  Psychiatric:        Mood and Affect: Mood normal.        Behavior: Behavior normal.     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   Epigastric pain: CBC demonstrated slight leukocytosis of 11.1,  otherwise normal. CMP wnl. Suspect non-cardiac in nature due to EKG demonstrating normal sinus rhythm without abnormality and negligible troponin of <2. MSK etiology less likely due to sharp episodic nature unable to be reproduced upon physical exam. Pancreatitis less likely with normal lipase of 35. Remaining differential includes GERD, hiatal hernia.   Suprapubic cramping: As she has not yet obtained confirmation of IUP, obtained transvaginal  ultrasound. US showed viable IUP with EGA 7.2w by crown-rump length. B-hCG quant [id number].  Assessment: [name] a 19 yo G2P1 with IUP confirmed by MAU [place] currently at 7.3w gestation by reliable LMP who presents with c/o episodic sharp epigastric pain that has been determined to be non-cardiac, non-MSK, and non-osseus in nature. Viable IUP confirmed on [place].  Plan: Discharge home with return precautions and instructions to establish care with an OB provider. Given nausea and vomiting in MAU, send script for zofran. Discussed GERD lifestyle mgmt w patient.  Fayette Pho, MD   I saw and evaluated the patient. I agree with the findings and the plan of care as documented in the resident's note.  Casper Harrison, MD [facility] Fellow, [facility] for [facility], [facility]

## 2020-09-28 DIAGNOSIS — Z3A01 Less than 8 weeks gestation of pregnancy: Secondary | ICD-10-CM | POA: Diagnosis not present

## 2020-09-28 DIAGNOSIS — O208 Other hemorrhage in early pregnancy: Secondary | ICD-10-CM | POA: Diagnosis not present

## 2020-09-28 LAB — COMPREHENSIVE METABOLIC PANEL
ALT: 16 U/L (ref 0–44)
AST: 17 U/L (ref 15–41)
Albumin: 3.6 g/dL (ref 3.5–5.0)
Alkaline Phosphatase: 43 U/L (ref 38–126)
Anion gap: 7 (ref 5–15)
BUN: 7 mg/dL (ref 6–20)
CO2: 23 mmol/L (ref 22–32)
Calcium: 8.4 mg/dL — ABNORMAL LOW (ref 8.9–10.3)
Chloride: 103 mmol/L (ref 98–111)
Creatinine, Ser: 0.67 mg/dL (ref 0.44–1.00)
GFR, Estimated: >60 mL/min (ref >60)
Glucose, Bld: 90 mg/dL (ref 70–99)
Potassium: 3.7 mmol/L (ref 3.5–5.1)
Sodium: 133 mmol/L — ABNORMAL LOW (ref 135–145)
Total Bilirubin: 0.4 mg/dL (ref 0.3–1.2)
Total Protein: 5.9 g/dL — ABNORMAL LOW (ref 6.5–8.1)

## 2020-09-28 LAB — LIPASE, BLOOD: Lipase: 35 U/L (ref 11–51)

## 2020-09-28 LAB — HIV ANTIBODY (ROUTINE TESTING W REFLEX): HIV Screen 4th Generation wRfx: NONREACTIVE

## 2020-09-28 LAB — HCG, QUANTITATIVE, PREGNANCY: hCG, Beta Chain, Quant, S: 74493 m[IU]/mL — ABNORMAL HIGH (ref <5)

## 2020-09-28 LAB — TROPONIN I (HIGH SENSITIVITY): Troponin I (High Sensitivity): <2 ng/L (ref <18)

## 2020-09-28 MED ORDER — ONDANSETRON 4 MG PO TBDP: 4.0000 mg | ORAL_TABLET | Freq: Once | ORAL | 0 refills | Status: AC

## 2020-09-28 MED ORDER — ONDANSETRON 4 MG PO TBDP
4.0000 mg | ORAL_TABLET | Freq: Once | ORAL | Status: AC
  Administered 2020-09-28: 4 mg via ORAL
  Filled 2020-09-28: qty 1

## 2020-09-28 NOTE — Discharge Instructions (Signed)
First Trimester of Pregnancy  The first trimester of pregnancy is from week 1 until the end of week 13 (months 1 through 3). During this time, your baby will begin to develop inside you. At 6-8 weeks, the eyes and face are formed, and the heartbeat can be seen on ultrasound. At the end of 12 weeks, all the baby's organs are formed. Prenatal care is all the medical care you receive before the birth of your baby. Make sure you get good prenatal care and follow all of your doctor's instructions. Follow these instructions at home: Medicines  Take over-the-counter and prescription medicines only as told by your doctor. Some medicines are safe and some medicines are not safe during pregnancy.  Take a prenatal vitamin that contains at least 600 micrograms (mcg) of folic acid.  If you have trouble pooping (constipation), take medicine that will make your stool soft (stool softener) if your doctor approves. Eating and drinking   Eat regular, healthy meals.  Your doctor will tell you the amount of weight gain that is right for you.  Avoid raw meat and uncooked cheese.  If you feel sick to your stomach (nauseous) or throw up (vomit): ? Eat 4 or 5 small meals a day instead of 3 large meals. ? Try eating a few soda crackers. ? Drink liquids between meals instead of during meals.  To prevent constipation: ? Eat foods that are high in fiber, like fresh fruits and vegetables, whole grains, and beans. ? Drink enough fluids to keep your pee (urine) clear or pale yellow. Activity  Exercise only as told by your doctor. Stop exercising if you have cramps or pain in your lower belly (abdomen) or low back.  Do not exercise if it is too hot, too humid, or if you are in a place of great height (high altitude).  Try to avoid standing for long periods of time. Move your legs often if you must stand in one place for a long time.  Avoid heavy lifting.  Wear low-heeled shoes. Sit and stand up  straight.  You can have sex unless your doctor tells you not to. Relieving pain and discomfort  Wear a good support bra if your breasts are sore.  Take warm water baths (sitz baths) to soothe pain or discomfort caused by hemorrhoids. Use hemorrhoid cream if your doctor says it is okay.  Rest with your legs raised if you have leg cramps or low back pain.  If you have puffy, bulging veins (varicose veins) in your legs: ? Wear support hose or compression stockings as told by your doctor. ? Raise (elevate) your feet for 15 minutes, 3-4 times a day. ? Limit salt in your food. Prenatal care  Schedule your prenatal visits by the twelfth week of pregnancy.  Write down your questions. Take them to your prenatal visits.  Keep all your prenatal visits as told by your doctor. This is important. Safety  Wear your seat belt at all times when driving.  Make a list of emergency phone numbers. The list should include numbers for family, friends, the hospital, and police and fire departments. General instructions  Ask your doctor for a referral to a local prenatal class. Begin classes no later than at the start of month 6 of your pregnancy.  Ask for help if you need counseling or if you need help with nutrition. Your doctor can give you advice or tell you where to go for help.  Do not use hot tubs, steam   rooms, or saunas.  Do not douche or use tampons or scented sanitary pads.  Do not cross your legs for long periods of time.  Avoid all herbs and alcohol. Avoid drugs that are not approved by your doctor.  Do not use any tobacco products, including cigarettes, chewing tobacco, and electronic cigarettes. If you need help quitting, ask your doctor. You may get counseling or other support to help you quit.  Avoid cat litter boxes and soil used by cats. These carry germs that can cause birth defects in the baby and can cause a loss of your baby (miscarriage) or stillbirth.  Visit your dentist.  At home, brush your teeth with a soft toothbrush. Be gentle when you floss. Contact a doctor if:  You are dizzy.  You have mild cramps or pressure in your lower belly.  You have a nagging pain in your belly area.  You continue to feel sick to your stomach, you throw up, or you have watery poop (diarrhea).  You have a bad smelling fluid coming from your vagina.  You have pain when you pee (urinate).  You have increased puffiness (swelling) in your face, hands, legs, or ankles. Get help right away if:  You have a fever.  You are leaking fluid from your vagina.  You have spotting or bleeding from your vagina.  You have very bad belly cramping or pain.  You gain or lose weight rapidly.  You throw up blood. It may look like coffee grounds.  You are around people who have German measles, fifth disease, or chickenpox.  You have a very bad headache.  You have shortness of breath.  You have any kind of trauma, such as from a fall or a car accident. Summary  The first trimester of pregnancy is from week 1 until the end of week 13 (months 1 through 3).  To take care of yourself and your unborn baby, you will need to eat healthy meals, take medicines only if your doctor tells you to do so, and do activities that are safe for you and your baby.  Keep all follow-up visits as told by your doctor. This is important as your doctor will have to ensure that your baby is healthy and growing well. This information is not intended to replace advice given to you by your health care provider. Make sure you discuss any questions you have with your health care provider. Document Revised: 02/23/2019 Document Reviewed: 11/10/2016 Elsevier Patient Education  2020 [employer]  

## 2020-09-30 NOTE — Telephone Encounter (Signed)
Transition Care Management Unsuccessful Follow-up Telephone Call  Date of discharge and from where:   [facility]  Attempts:  3rd Attempt  Reason for unsuccessful TCM follow-up call:  Left voice message

## 2020-10-02 ENCOUNTER — Other Ambulatory Visit: Payer: Self-pay

## 2020-10-02 ENCOUNTER — Ambulatory Visit: Payer: Medicaid Other

## 2020-10-02 VITALS — BP 101/70 | HR 72

## 2020-10-02 DIAGNOSIS — A749 Chlamydial infection, unspecified: Secondary | ICD-10-CM

## 2020-10-02 DIAGNOSIS — Z202 Contact with and (suspected) exposure to infections with a predominantly sexual mode of transmission: Secondary | ICD-10-CM

## 2020-10-02 DIAGNOSIS — O98811 Other maternal infectious and parasitic diseases complicating pregnancy, first trimester: Secondary | ICD-10-CM

## 2020-10-03 ENCOUNTER — Other Ambulatory Visit (HOSPITAL_COMMUNITY)
Admission: RE | Admit: 2020-10-03 | Discharge: 2020-10-03 | Disposition: A | Discharge status: Home / Self Care | Payer: Medicaid Other | Source: Ambulatory Visit | Attending: Obstetrics and Gynecology | Admitting: Obstetrics and Gynecology

## 2020-10-03 DIAGNOSIS — Z202 Contact with and (suspected) exposure to infections with a predominantly sexual mode of transmission: Secondary | ICD-10-CM | POA: Insufficient documentation

## 2020-10-03 NOTE — Progress Notes (Signed)
SUBJECTIVE:  19 y.o. female complains of vaginal discharge for couple of days. Denies abnormal vaginal bleeding or significant pelvic pain or fever. No UTI symptoms. History of known exposure to STD.  Patient's last menstrual period was 08/06/2020 (exact date).  OBJECTIVE:  She appears well, afebrile. Urine dipstick: not done   ASSESSMENT:  Vaginal Discharge small amount Vaginal Odor: none    PLAN:  GC, chlamydia, trichomonas, BVAG, CVAG probe sent to lab. Treatment: To be determined once lab results are received ROV prn if symptoms persist or worsen.

## 2020-10-04 LAB — CERVICOVAGINAL ANCILLARY ONLY
Bacterial Vaginitis (gardnerella): POSITIVE — AB
Candida Glabrata: NEGATIVE
Candida Vaginitis: NEGATIVE
Chlamydia: POSITIVE — AB
Comment: NEGATIVE
Comment: NEGATIVE
Comment: NEGATIVE
Comment: NEGATIVE
Comment: NEGATIVE
Comment: NORMAL
Neisseria Gonorrhea: NEGATIVE
Trichomonas: NEGATIVE

## 2020-10-07 DIAGNOSIS — A749 Chlamydial infection, unspecified: Secondary | ICD-10-CM | POA: Insufficient documentation

## 2020-10-07 MED ORDER — METRONIDAZOLE 500 MG PO TABS: 500.0000 mg | ORAL_TABLET | Freq: Two times a day (BID) | ORAL | 0 refills | Status: DC

## 2020-10-07 MED ORDER — AZITHROMYCIN 500 MG PO TABS: 1000.0000 mg | ORAL_TABLET | Freq: Once | ORAL | 0 refills | Status: AC

## 2020-10-07 NOTE — Addendum Note (Signed)
Addended by: Leroy Libman on: 10/07/2020 04:47 PM   Modules accepted: Orders

## 2020-10-08 ENCOUNTER — Telehealth: Payer: Self-pay

## 2020-10-08 NOTE — Telephone Encounter (Signed)
TC to pt to make aware of +CT Pt made aware to refrain from any unprotected intercourse /partner needs treatment Needs TOC  Pt voiced understanding Forms faxed to [facility].

## 2020-10-08 NOTE — Telephone Encounter (Signed)
-----   Message from Conan Bowens, MD sent at 10/07/2020  4:47 PM EST ----- +chlamydia and BV, Rx sent to pharmacy, please call patient and let her know Will need TOC 1 month

## 2020-10-21 ENCOUNTER — Ambulatory Visit (INDEPENDENT_AMBULATORY_CARE_PROVIDER_SITE_OTHER): Payer: Medicaid Other

## 2020-10-21 DIAGNOSIS — A749 Chlamydial infection, unspecified: Secondary | ICD-10-CM

## 2020-10-21 DIAGNOSIS — Z3A10 10 weeks gestation of pregnancy: Secondary | ICD-10-CM

## 2020-10-21 DIAGNOSIS — O98819 Other maternal infectious and parasitic diseases complicating pregnancy, unspecified trimester: Secondary | ICD-10-CM

## 2020-10-21 DIAGNOSIS — Z3480 Encounter for supervision of other normal pregnancy, unspecified trimester: Secondary | ICD-10-CM | POA: Insufficient documentation

## 2020-10-21 MED ORDER — BLOOD PRESSURE KIT DEVI: 1.0000 | 0 refills | Status: DC

## 2020-10-21 NOTE — Progress Notes (Addendum)
  Virtual Visit via Telephone Note  I connected with [facility] on 10/21/20 at  2:00 PM EST by telephone and verified that I am speaking with the correct person using two identifiers.  Location: Patient: home Provider: [facility]   I discussed the limitations, risks, security and privacy concerns of performing an evaluation and management service by telephone and the availability of in person appointments. I also discussed with the patient that there may be a patient responsible charge related to this service. The patient expressed understanding and agreed to proceed.   History of Present Illness: PRENATAL INTAKE SUMMARY  [facility] today New OB Nurse Interview.  OB History    Gravida  2   Para  1   Term  1   Preterm      AB      Living  1     SAB      TAB      Ectopic      Multiple  0   Live Births  1          I have reviewed the patient's medical, obstetrical, social, and family histories, medications, and available lab results.  SUBJECTIVE She has no unusual complaints   Observations/Objective: Initial nurse interview for history/labs (New OB)  EDD: 05/13/2021 GA: [redacted]w[redacted]d G2 P1 FHT: NA  GENERAL APPEARANCE: oriented to person, place and time  Assessment and Plan: Normal pregnancy PHQ-9=9 Pregnancy Risk Screening done Babyscripts done BP Cuff ordered  Prenatal care: @ [facility] Labs to be completed at next visit with Sharen Counter  on 10/28/2020.   Follow Up Instructions:   I discussed the assessment and treatment plan with the patient. The patient was provided an opportunity to ask questions and all were answered. The patient agreed with the plan and demonstrated an understanding of the instructions.   The patient was advised to call back or seek an in-person evaluation if the symptoms worsen or if the condition fails to improve as anticipated.  I provided 20 minutes of non-face-to-face time during this encounter.   Maretta Bees, RMA

## 2020-10-21 NOTE — Progress Notes (Signed)
Patient was assessed and managed by nursing staff during this encounter. I have reviewed the chart and agree with the documentation and plan. I have also made any necessary editorial changes.  Sharen Counter, CNM 10/21/2020 4:53 PM

## 2020-10-24 DIAGNOSIS — Z3201 Encounter for pregnancy test, result positive: Secondary | ICD-10-CM | POA: Diagnosis not present

## 2020-10-24 DIAGNOSIS — Z1159 Encounter for screening for other viral diseases: Secondary | ICD-10-CM | POA: Diagnosis not present

## 2020-10-24 DIAGNOSIS — Z0000 Encounter for general adult medical examination without abnormal findings: Secondary | ICD-10-CM | POA: Diagnosis not present

## 2020-10-24 DIAGNOSIS — R5383 Other fatigue: Secondary | ICD-10-CM | POA: Diagnosis not present

## 2020-10-24 DIAGNOSIS — R12 Heartburn: Secondary | ICD-10-CM | POA: Diagnosis not present

## 2020-10-28 ENCOUNTER — Other Ambulatory Visit: Payer: Self-pay

## 2020-10-28 ENCOUNTER — Encounter: Payer: Self-pay | Admitting: Advanced Practice Midwife

## 2020-10-28 ENCOUNTER — Other Ambulatory Visit (HOSPITAL_COMMUNITY)
Admission: RE | Admit: 2020-10-28 | Discharge: 2020-10-28 | Disposition: A | Discharge status: Home / Self Care | Payer: Medicaid Other | Source: Ambulatory Visit | Attending: Advanced Practice Midwife | Admitting: Advanced Practice Midwife

## 2020-10-28 ENCOUNTER — Ambulatory Visit (INDEPENDENT_AMBULATORY_CARE_PROVIDER_SITE_OTHER): Payer: Medicaid Other | Admitting: Advanced Practice Midwife

## 2020-10-28 VITALS — BP 107/72 | HR 72 | Wt 119.0 lb

## 2020-10-28 DIAGNOSIS — Z3481 Encounter for supervision of other normal pregnancy, first trimester: Secondary | ICD-10-CM | POA: Diagnosis not present

## 2020-10-28 DIAGNOSIS — O98811 Other maternal infectious and parasitic diseases complicating pregnancy, first trimester: Secondary | ICD-10-CM

## 2020-10-28 DIAGNOSIS — Z3480 Encounter for supervision of other normal pregnancy, unspecified trimester: Secondary | ICD-10-CM | POA: Diagnosis not present

## 2020-10-28 DIAGNOSIS — Z3A11 11 weeks gestation of pregnancy: Secondary | ICD-10-CM

## 2020-10-28 DIAGNOSIS — R12 Heartburn: Secondary | ICD-10-CM

## 2020-10-28 DIAGNOSIS — O26891 Other specified pregnancy related conditions, first trimester: Secondary | ICD-10-CM

## 2020-10-28 DIAGNOSIS — R079 Chest pain, unspecified: Secondary | ICD-10-CM | POA: Diagnosis not present

## 2020-10-28 DIAGNOSIS — I951 Orthostatic hypotension: Secondary | ICD-10-CM | POA: Diagnosis not present

## 2020-10-28 DIAGNOSIS — Z8759 Personal history of other complications of pregnancy, childbirth and the puerperium: Secondary | ICD-10-CM | POA: Diagnosis not present

## 2020-10-28 DIAGNOSIS — A749 Chlamydial infection, unspecified: Secondary | ICD-10-CM

## 2020-10-28 MED ORDER — ASPIRIN EC 81 MG PO TBEC: 81.0000 mg | DELAYED_RELEASE_TABLET | Freq: Every day | ORAL | 11 refills | Status: DC

## 2020-10-28 NOTE — Progress Notes (Signed)
Subjective:   Xandra Laramee is a 19 y.o. G2P1001 at 37w6dby LMP being seen today for her first obstetrical visit.  Her obstetrical history is significant for GHTN with first pregnancy, postural hypotension currently and with first pregnancy in first trimester and has Acne vulgaris; Disordered eating; Migraine without aura and without status migrainosus, not intractable; Adjustment disorder with mixed anxiety and depressed mood; Marijuana abuse; Severe recurrent major depression without psychotic features ([place]; Chlamydia infection affecting pregnancy; Supervision of other normal pregnancy, antepartum; and History of gestational hypertension on their problem list.. Patient does intend to breast feed. Pregnancy history fully reviewed.  Patient reports some abdominal cramping intermittently.  HISTORY: OB History  Gravida Para Term Preterm AB Living  2 1 1  0 0 1  SAB IAB Ectopic Multiple Live Births  0 0 0 0 1    # Outcome Date GA Lbr Len/2nd Weight Sex Delivery Anes PTL Lv  2 Current           1 Term 12/22/18 [id number]:50 / 00:22 6 lb 11.8 oz (3.055 kg) M Vag-Spont EPI  LIV     Name: Ketcham,BOY Mikiyah     Apgar1: 8  Apgar5: 9   Past Medical History:  Diagnosis Date  . Anxiety   . Disordered eating 12/25/2015  . Hypertension   . Migraines   . Pregnancy induced hypertension    Past Surgical History:  Procedure Laterality Date  . CHOLECYSTECTOMY N/A 10/17/2014   Procedure: LAPAROSCOPIC CHOLECYSTECTOMY WITHOUT INTRAOPERATIVE CHOLANGIOGRAM;  Surgeon: M.Jerilynn MagesShGerald StabsMD;  [facility]: [facility];  Laterality: N/A;  laparoscopic cholecystectomy  . CHOLECYSTECTOMY  2015  . DENTAL SURGERY     Family History  Problem Relation Age of Onset  . Alcohol abuse Father   . Hypertension Father   . Cancer Paternal Grandmother   . Asthma Neg Hx   . Depression Neg Hx   . Diabetes Neg Hx   . Drug abuse Neg Hx   . Early death Neg Hx   . Hearing loss Neg Hx   . Heart disease  Neg Hx   . Hyperlipidemia Neg Hx   . Kidney disease Neg Hx   . Mental illness Neg Hx   . Mental retardation Neg Hx   . Stroke Neg Hx    Social History   Tobacco Use  . Smoking status: Never Smoker  . Smokeless tobacco: Never Used  . Tobacco comment: last marijuana smoked 06-25-18  Vaping Use  . Vaping Use: Never used  Substance Use Topics  . Alcohol use: No    Alcohol/week: 0.0 standard drinks  . Drug use: Not Currently    Types: Marijuana    Comment: stopped 06-25-18   No Known Allergies Current Outpatient Medications on File Prior to Visit  Medication Sig Dispense Refill  . Prenatal Vit-Fe Fumarate-FA (PRENATAL VITAMIN) 27-0.8 MG TABS Take 1 tablet by mouth daily. 90 tablet 3  . acetaminophen (TYLENOL) 325 MG tablet Take 2 tablets (650 mg total) by mouth every 4 (four) hours as needed (for pain scale < 4). (Patient not taking: No sig reported) 30 tablet 0  . Blood Pressure Monitoring (BLOOD PRESSURE KIT) DEVI 1 kit by Does not apply route once a week. Check Blood Pressure regularly and record readings into the [employer] App.  Large Cuff.  DX O90.0 1 each 0  . ibuprofen (ADVIL,MOTRIN) 600 MG tablet Take 1 tablet (600 mg total) by mouth every 6 (six) hours. (Patient not  taking: No sig reported) 30 tablet 0  . metroNIDAZOLE (FLAGYL) 500 MG tablet Take 1 tablet (500 mg total) by mouth 2 (two) times daily. (Patient not taking: No sig reported) 14 tablet 0  . ondansetron (ZOFRAN-ODT) 4 MG disintegrating tablet Take by mouth.     No current facility-administered medications on file prior to visit.     Indications for ASA therapy (per uptodate) One of the following: Previous pregnancy with preeclampsia, especially early onset and with an adverse outcome No Multifetal gestation No Chronic hypertension No Type 1 or 2 diabetes mellitus No Chronic kidney disease No Autoimmune disease (antiphospholipid syndrome, systemic lupus erythematosus) No   Two or more of the  following: Nulliparity No Obesity (body mass index >30 kg/m2) No Family history of preeclampsia in mother or sister No Age ?35 years No Sociodemographic characteristics (African American race, low socioeconomic level) No Personal risk factors (eg, previous pregnancy with low birth weight or small for gestational age infant, previous adverse pregnancy outcome [eg, stillbirth], interval >10 years between pregnancies) No   Indications for early 1 hour GTT (per uptodate)  BMI >25 (>23 in Asian women) AND one of the following  Gestational diabetes mellitus in a previous pregnancy No Glycated hemoglobin ?5.7 percent (39 mmol/mol), impaired glucose tolerance, or impaired fasting glucose on previous testing No First-degree relative with diabetes No High-risk race/ethnicity (eg, African American, Latino, Native American, [place] American, Pacific Islander) No History of cardiovascular disease No Hypertension or on therapy for hypertension No High-density lipoprotein cholesterol level <35 mg/dL (0.90 mmol/L) and/or a triglyceride level >250 mg/dL (2.82 mmol/L) No Polycystic ovary syndrome No Physical inactivity No Other clinical condition associated with insulin resistance (eg, severe obesity, acanthosis nigricans) No Previous birth of an infant weighing ?4000 g No Previous stillbirth of unknown cause No Exam   Vitals:   10/28/20 1311  BP: 107/72  Pulse: 72  Weight: 119 lb (54 kg)      Uterus:     Pelvic Exam: Perineum: no hemorrhoids, normal perineum   Vulva: normal external genitalia, no lesions   Vagina:  normal mucosa, normal discharge   Cervix: no lesions and normal, pap smear done.    Adnexa: normal adnexa and no mass, fullness, tenderness   Bony Pelvis: average  System: General: well-developed, well-nourished female in no acute distress   Breast:  normal appearance, no masses or tenderness   Skin: normal coloration and turgor, no rashes   Neurologic: oriented, normal, negative,  normal mood   Extremities: normal strength, tone, and muscle mass, ROM of all joints is normal   HEENT PERRLA, extraocular movement intact and sclera clear, anicteric   Mouth/Teeth mucous membranes moist, pharynx normal without lesions and dental hygiene good   Neck supple and no masses   Cardiovascular: regular rate and rhythm   Respiratory:  no respiratory distress, normal breath sounds   Abdomen: soft, non-tender; bowel sounds normal; no masses,  no organomegaly     Assessment:   Pregnancy: G2P1001 Patient Active Problem List   Diagnosis Date Noted  . History of gestational hypertension 10/28/2020  . Supervision of other normal pregnancy, antepartum 10/21/2020  . Chlamydia infection affecting pregnancy 10/07/2020  . Severe recurrent major depression without psychotic features ([place]) 03/11/2018  . Marijuana abuse 10/21/2017  . Adjustment disorder with mixed anxiety and depressed mood 01/13/2016  . Migraine without aura and without status migrainosus, not intractable 01/01/2016  . Disordered eating 12/25/2015  . Acne vulgaris 08/06/2015     Plan:  1.  Supervision of other normal pregnancy, antepartum --Anticipatory guidance about next visits/weeks of pregnancy given. --Next visit in 4 weeks  - Cervicovaginal ancillary only( [place]) - Culture, OB Urine - Genetic Screening - CBC/D/Plt+RPR+Rh+ABO+Rub Ab...  2. History of gestational hypertension  - Comp Met (CMET) - Protein / creatinine ratio, urine - aspirin EC 81 MG tablet; Take 1 tablet (81 mg total) by mouth daily. Swallow whole.  Dispense: 30 tablet; Refill: 11  3. Chlamydia infection affecting pregnancy in first trimester --Chlamydia + 11/18. Treated. --TOC today  4. Heartburn during pregnancy in first trimester --Pt seeing PCP for acid reflux, taking Pepcid BID which is helping. --Consider PPI, pt to notify us if Pepcid is not helping or is not enough for symptoms. --Pt has nausea, but improved when acid  reflux is improved, has Zofran for breakthrough nausea.  Initial labs drawn. Continue prenatal vitamins. Discussed and offered genetic screening options, including Quad screen/AFP, NIPS testing, and option to decline testing. Benefits/risks/alternatives reviewed. Pt aware that anatomy US is form of genetic screening with lower accuracy in detecting trisomies than blood work.  Pt chooses genetic screening today. NIPS: ordered. Ultrasound discussed; fetal anatomic survey: requested. Problem list reviewed and updated. The nature of [place] with multiple MDs and other Advanced Practice Providers was explained to patient; also emphasized that residents, students are part of our team. Routine obstetric precautions reviewed. Return in about 4 weeks (around 11/25/2020).   Fatima Blank, CNM 10/28/20 1:56 PM

## 2020-10-28 NOTE — Patient Instructions (Signed)
First Trimester of Pregnancy The first trimester of pregnancy is from week 1 until the end of week 13 (months 1 through 3). A week after a sperm fertilizes an egg, the egg will implant on the wall of the uterus. This embryo will begin to develop into a baby. Genes from you and your partner will form the baby. The female genes will determine whether the baby will be a boy or a girl. At 6-8 weeks, the eyes and face will be formed, and the heartbeat can be seen on ultrasound. At the end of 12 weeks, all the baby's organs will be formed. Now that you are pregnant, you will want to do everything you can to have a healthy baby. Two of the most important things are to get good prenatal care and to follow your health care provider's instructions. Prenatal care is all the medical care you receive before the baby's birth. This care will help prevent, find, and treat any problems during the pregnancy and childbirth. Body changes during your first trimester Your body goes through many changes during pregnancy. The changes vary from woman to woman.  You may gain or lose a couple of pounds at first.  You may feel sick to your stomach (nauseous) and you may throw up (vomit). If the vomiting is uncontrollable, call your health care provider.  You may tire easily.  You may develop headaches that can be relieved by medicines. All medicines should be approved by your health care provider.  You may urinate more often. Painful urination may mean you have a bladder infection.  You may develop heartburn as a result of your pregnancy.  You may develop constipation because certain hormones are causing the muscles that push stool through your intestines to slow down.  You may develop hemorrhoids or swollen veins (varicose veins).  Your breasts may begin to grow larger and become tender. Your nipples may stick out more, and the tissue that surrounds them (areola) may become darker.  Your gums may bleed and may be  sensitive to brushing and flossing.  Dark spots or blotches (chloasma, mask of pregnancy) may develop on your face. This will likely fade after the baby is born.  Your menstrual periods will stop.  You may have a loss of appetite.  You may develop cravings for certain kinds of food.  You may have changes in your emotions from day to day, such as being excited to be pregnant or being concerned that something may go wrong with the pregnancy and baby.  You may have more vivid and strange dreams.  You may have changes in your hair. These can include thickening of your hair, rapid growth, and changes in texture. Some women also have hair loss during or after pregnancy, or hair that feels dry or thin. Your hair will most likely return to normal after your baby is born. What to expect at prenatal visits During a routine prenatal visit:  You will be weighed to make sure you and the baby are growing normally.  Your blood pressure will be taken.  Your abdomen will be measured to track your baby's growth.  The fetal heartbeat will be listened to between weeks 10 and 14 of your pregnancy.  Test results from any previous visits will be discussed. Your health care provider may ask you:  How you are feeling.  If you are feeling the baby move.  If you have had any abnormal symptoms, such as leaking fluid, bleeding, severe headaches, or abdominal   cramping.  If you are using any tobacco products, including cigarettes, chewing tobacco, and electronic cigarettes.  If you have any questions. Other tests that may be performed during your first trimester include:  Blood tests to find your blood type and to check for the presence of any previous infections. The tests will also be used to check for low iron levels (anemia) and protein on red blood cells (Rh antibodies). Depending on your risk factors, or if you previously had diabetes during pregnancy, you may have tests to check for high blood sugar  that affects pregnant women (gestational diabetes).  Urine tests to check for infections, diabetes, or protein in the urine.  An ultrasound to confirm the proper growth and development of the baby.  Fetal screens for spinal cord problems (spina bifida) and Down syndrome.  HIV (human immunodeficiency virus) testing. Routine prenatal testing includes screening for HIV, unless you choose not to have this test.  You may need other tests to make sure you and the baby are doing well. Follow these instructions at home: Medicines  Follow your health care provider's instructions regarding medicine use. Specific medicines may be either safe or unsafe to take during pregnancy.  Take a prenatal vitamin that contains at least 600 micrograms (mcg) of folic acid.  If you develop constipation, try taking a stool softener if your health care provider approves. Eating and drinking   Eat a balanced diet that includes fresh fruits and vegetables, whole grains, good sources of protein such as meat, eggs, or tofu, and low-fat dairy. Your health care provider will help you determine the amount of weight gain that is right for you.  Avoid raw meat and uncooked cheese. These carry germs that can cause birth defects in the baby.  Eating four or five small meals rather than three large meals a day may help relieve nausea and vomiting. If you start to feel nauseous, eating a few soda crackers can be helpful. Drinking liquids between meals, instead of during meals, also seems to help ease nausea and vomiting.  Limit foods that are high in fat and processed sugars, such as fried and sweet foods.  To prevent constipation: ? Eat foods that are high in fiber, such as fresh fruits and vegetables, whole grains, and beans. ? Drink enough fluid to keep your urine clear or pale yellow. Activity  Exercise only as directed by your health care provider. Most women can continue their usual exercise routine during  pregnancy. Try to exercise for 30 minutes at least 5 days a week. Exercising will help you: ? Control your weight. ? Stay in shape. ? Be prepared for labor and delivery.  Experiencing pain or cramping in the lower abdomen or lower back is a good sign that you should stop exercising. Check with your health care provider before continuing with normal exercises.  Try to avoid standing for long periods of time. Move your legs often if you must stand in one place for a long time.  Avoid heavy lifting.  Wear low-heeled shoes and practice good posture.  You may continue to have sex unless your health care provider tells you not to. Relieving pain and discomfort  Wear a good support bra to relieve breast tenderness.  Take warm sitz baths to soothe any pain or discomfort caused by hemorrhoids. Use hemorrhoid cream if your health care provider approves.  Rest with your legs elevated if you have leg cramps or low back pain.  If you develop varicose veins in   your legs, wear support hose. Elevate your feet for 15 minutes, 3-4 times a day. Limit salt in your diet. Prenatal care  Schedule your prenatal visits by the twelfth week of pregnancy. They are usually scheduled monthly at first, then more often in the last 2 months before delivery.  Write down your questions. Take them to your prenatal visits.  Keep all your prenatal visits as told by your health care provider. This is important. Safety  Wear your seat belt at all times when driving.  Make a list of emergency phone numbers, including numbers for family, friends, the hospital, and police and fire departments. General instructions  Ask your health care provider for a referral to a local prenatal education class. Begin classes no later than the beginning of month 6 of your pregnancy.  Ask for help if you have counseling or nutritional needs during pregnancy. Your health care provider can offer advice or refer you to specialists for help  with various needs.  Do not use hot tubs, steam rooms, or saunas.  Do not douche or use tampons or scented sanitary pads.  Do not cross your legs for long periods of time.  Avoid cat litter boxes and soil used by cats. These carry germs that can cause birth defects in the baby and possibly loss of the fetus by miscarriage or stillbirth.  Avoid all smoking, herbs, alcohol, and medicines not prescribed by your health care provider. Chemicals in these products affect the formation and growth of the baby.  Do not use any products that contain nicotine or tobacco, such as cigarettes and e-cigarettes. If you need help quitting, ask your health care provider. You may receive counseling support and other resources to help you quit.  Schedule a dentist appointment. At home, brush your teeth with a soft toothbrush and be gentle when you floss. Contact a health care provider if:  You have dizziness.  You have mild pelvic cramps, pelvic pressure, or nagging pain in the abdominal area.  You have persistent nausea, vomiting, or diarrhea.  You have a bad smelling vaginal discharge.  You have pain when you urinate.  You notice increased swelling in your face, hands, legs, or ankles.  You are exposed to fifth disease or chickenpox.  You are exposed to German measles (rubella) and have never had it. Get help right away if:  You have a fever.  You are leaking fluid from your vagina.  You have spotting or bleeding from your vagina.  You have severe abdominal cramping or pain.  You have rapid weight gain or loss.  You vomit blood or material that looks like coffee grounds.  You develop a severe headache.  You have shortness of breath.  You have any kind of trauma, such as from a fall or a car accident. Summary  The first trimester of pregnancy is from week 1 until the end of week 13 (months 1 through 3).  Your body goes through many changes during pregnancy. The changes vary from  woman to woman.  You will have routine prenatal visits. During those visits, your health care provider will examine you, discuss any test results you may have, and talk with you about how you are feeling. This information is not intended to replace advice given to you by your health care provider. Make sure you discuss any questions you have with your health care provider. Document Revised: 10/15/2017 Document Reviewed: 10/14/2016 Elsevier Patient Education  2020 Elsevier Inc.  

## 2020-10-28 NOTE — Progress Notes (Signed)
NOB  NOB Intake already completed.   Recent MAU visit  Due to pain and cramping.  Genetic Testing : Desires Pt is currently seeing a cardiologist fpr B/P issues F/U is in a few months. .   CC: None

## 2020-10-29 LAB — CBC/D/PLT+RPR+RH+ABO+RUB AB...
Antibody Screen: NEGATIVE
Basophils Absolute: 0.1 10*3/uL (ref 0.0–0.2)
Basos: 1 %
EOS (ABSOLUTE): 0.2 10*3/uL (ref 0.0–0.4)
Eos: 2 %
HCV Ab: <0.1 s/co ratio (ref 0.0–0.9)
HIV Screen 4th Generation wRfx: NONREACTIVE
Hematocrit: 38.6 % (ref 34.0–46.6)
Hemoglobin: 12.9 g/dL (ref 11.1–15.9)
Hepatitis B Surface Ag: NEGATIVE
Immature Grans (Abs): 0 10*3/uL (ref 0.0–0.1)
Immature Granulocytes: 0 %
Lymphocytes Absolute: 2.6 10*3/uL (ref 0.7–3.1)
Lymphs: 26 %
MCH: 30.9 pg (ref 26.6–33.0)
MCHC: 33.4 g/dL (ref 31.5–35.7)
MCV: 92 fL (ref 79–97)
Monocytes Absolute: 0.6 10*3/uL (ref 0.1–0.9)
Monocytes: 6 %
Neutrophils Absolute: 6.6 10*3/uL (ref 1.4–7.0)
Neutrophils: 65 %
Platelets: 304 10*3/uL (ref 150–450)
RBC: 4.18 x10E6/uL (ref 3.77–5.28)
RDW: 12.7 % (ref 11.7–15.4)
RPR Ser Ql: NONREACTIVE
Rh Factor: POSITIVE
Rubella Antibodies, IGG: 1.71 index (ref >0.99)
WBC: 10.1 10*3/uL (ref 3.4–10.8)

## 2020-10-29 LAB — COMPREHENSIVE METABOLIC PANEL
ALT: 16 IU/L (ref 0–32)
AST: 15 IU/L (ref 0–40)
Albumin/Globulin Ratio: 1.7 (ref 1.2–2.2)
Albumin: 4.3 g/dL (ref 3.9–5.0)
Alkaline Phosphatase: 47 IU/L (ref 42–106)
BUN/Creatinine Ratio: 7 — ABNORMAL LOW (ref 9–23)
BUN: 5 mg/dL — ABNORMAL LOW (ref 6–20)
Bilirubin Total: 0.3 mg/dL (ref 0.0–1.2)
CO2: 23 mmol/L (ref 20–29)
Calcium: 9.3 mg/dL (ref 8.7–10.2)
Chloride: 103 mmol/L (ref 96–106)
Creatinine, Ser: 0.76 mg/dL (ref 0.57–1.00)
GFR calc Af Amer: 131 mL/min/{1.73_m2} (ref >59)
GFR calc non Af Amer: 113 mL/min/{1.73_m2} (ref >59)
Globulin, Total: 2.6 g/dL (ref 1.5–4.5)
Glucose: 65 mg/dL (ref 65–99)
Potassium: 3.6 mmol/L (ref 3.5–5.2)
Sodium: 138 mmol/L (ref 134–144)
Total Protein: 6.9 g/dL (ref 6.0–8.5)

## 2020-10-29 LAB — CERVICOVAGINAL ANCILLARY ONLY
Chlamydia: NEGATIVE
Comment: NEGATIVE
Comment: NEGATIVE
Comment: NORMAL
Neisseria Gonorrhea: NEGATIVE
Trichomonas: NEGATIVE

## 2020-10-29 LAB — PROTEIN / CREATININE RATIO, URINE
Creatinine, Urine: 130.6 mg/dL
Protein, Ur: 9.3 mg/dL
Protein/Creat Ratio: 71 mg/g creat (ref 0–200)

## 2020-10-29 LAB — HCV INTERPRETATION

## 2020-10-30 LAB — URINE CULTURE, OB REFLEX

## 2020-10-30 LAB — CULTURE, OB URINE

## 2020-11-04 ENCOUNTER — Encounter: Payer: Self-pay | Admitting: Advanced Practice Midwife

## 2020-11-07 ENCOUNTER — Encounter: Payer: Self-pay | Admitting: Advanced Practice Midwife

## 2020-11-16 NOTE — L&D Delivery Note (Signed)
OB/GYN Faculty Practice Delivery Note  Stefanie King is a 20 y.o. G2P1001 s/p SVD at [redacted]w[redacted]d. She was admitted for labor.   ROM: 0h 21m with clear fluid GBS Status:  Negative/-- (05/31 0405) Maximum Maternal Temperature: 98.5  Labor Progress: Initial SVE: 7cm. She then progressed to complete.   Delivery Date/Time: 12:11 on 6/14 Delivery: Called to room and patient was complete and pushing. Head delivered LOP. Single nuchal cord present and reduced. Shoulder and body delivered in usual fashion. Infant with spontaneous cry, placed on mother's abdomen, dried and stimulated. Cord clamped x 2 after 1-minute delay, and cut by maternal grandmother. Cord blood drawn. Placenta delivered spontaneously with gentle cord traction. Fundus firm with massage and Pitocin. Labia, perineum, vagina, and cervix inspected inspected with first degree laceration repaired, periurethral not repaired. PP Liletta inserted. See separate note for details.   Baby Weight: pending  Placenta: Sent to L&D Complications: None Lacerations: first degree, repaired, periurethral not repaired EBL: 75 mL Analgesia: Epidural   Infant:  APGAR (1 MIN): 8   APGAR (5 MINS): 9   APGAR (10 MINS):     Casper Harrison, MD [facility] Family Medicine Fellow, [facility] for [facility], [facility] 04/29/2021, 12:48 PM

## 2020-11-25 ENCOUNTER — Ambulatory Visit (INDEPENDENT_AMBULATORY_CARE_PROVIDER_SITE_OTHER): Payer: Medicaid Other | Admitting: Advanced Practice Midwife

## 2020-11-25 ENCOUNTER — Other Ambulatory Visit: Payer: Self-pay

## 2020-11-25 VITALS — BP 104/68 | HR 61 | Wt 113.0 lb

## 2020-11-25 DIAGNOSIS — Z3480 Encounter for supervision of other normal pregnancy, unspecified trimester: Secondary | ICD-10-CM

## 2020-11-25 DIAGNOSIS — Z3A19 19 weeks gestation of pregnancy: Secondary | ICD-10-CM

## 2020-11-25 DIAGNOSIS — Z36 Encounter for antenatal screening for chromosomal anomalies: Secondary | ICD-10-CM | POA: Diagnosis not present

## 2020-11-25 DIAGNOSIS — Z3A15 15 weeks gestation of pregnancy: Secondary | ICD-10-CM

## 2020-11-25 NOTE — Progress Notes (Signed)
ROB/AFP 

## 2020-11-25 NOTE — Progress Notes (Signed)
   PRENATAL VISIT NOTE  Subjective:  Stefanie King is a 20 y.o. G2P1001 at [redacted]w[redacted]d being seen today for ongoing prenatal care.  She is currently monitored for the following issues for this low-risk pregnancy and has Acne vulgaris; Disordered eating; Migraine without aura and without status migrainosus, not intractable; Adjustment disorder with mixed anxiety and depressed mood; Marijuana abuse; Severe recurrent major depression without psychotic features (H[facility]); Chlamydia infection affecting pregnancy; Supervision of other normal pregnancy, antepartum; and History of gestational hypertension on their problem list.  Patient reports no complaints.  Contractions: Not present. Vag. Bleeding: None.   . Denies leaking of fluid.   The following portions of the patient's history were reviewed and updated as appropriate: allergies, current medications, past family history, past medical history, past social history, past surgical history and problem list.   Objective:   Vitals:   11/25/20 1135  BP: 104/68  Pulse: 61  Weight: 113 lb (51.3 kg)    Fetal Status: Fetal Heart Rate (bpm): 154         General:  Alert, oriented and cooperative. [name] in no acute distress.  Skin: Skin is warm and dry. No rash noted.   Cardiovascular: Normal heart rate noted  Respiratory: Normal respiratory effort, no problems with respiration noted  Abdomen: Soft, gravid, appropriate for gestational age.  Pain/Pressure: Absent     Pelvic: Cervical exam deferred        Extremities: Normal range of motion.  Edema: None  Mental Status: Normal mood and affect. Normal behavior. Normal judgment and thought content.   Assessment and Plan:  Pregnancy: G2P1001 at [redacted]w[redacted]d 1. Supervision of other normal pregnancy, antepartum --Anticipatory guidance about next visits/weeks of pregnancy given. --Next visit in 4 weeks virtually - [place] MFM OB COMP + 14 WK; Future  2. [redacted] weeks gestation of pregnancy  - AFP, Serum, Open Spina  Bifida   Preterm labor symptoms and general obstetric precautions including but not limited to vaginal bleeding, contractions, leaking of fluid and fetal movement were reviewed in detail with the patient. Please refer to After Visit Summary for other counseling recommendations.   Return in about 4 weeks (around 12/23/2020).  Future Appointments  Date Time Provider Department Center  12/23/2020  8:55 AM [facility], Wilmer Floor, CNM [facility] None  12/23/2020  2:45 PM [facility]    Sharen Counter, CNM

## 2020-11-27 LAB — AFP, SERUM, OPEN SPINA BIFIDA
AFP MoM: 1.21
AFP Value: 47.1 ng/mL
Gest. Age on Collection Date: 15.6 weeks
Maternal Age At EDD: 21 yr
OSBR Risk 1 IN: 6301
Test Results:: NEGATIVE
Weight: 113 [lb_av]

## 2020-12-23 ENCOUNTER — Ambulatory Visit: Payer: Medicaid Other | Attending: Advanced Practice Midwife

## 2020-12-23 ENCOUNTER — Telehealth (INDEPENDENT_AMBULATORY_CARE_PROVIDER_SITE_OTHER): Payer: Medicaid Other | Admitting: Advanced Practice Midwife

## 2020-12-23 ENCOUNTER — Other Ambulatory Visit: Payer: Self-pay

## 2020-12-23 VITALS — BP 121/69 | HR 73 | Wt 118.0 lb

## 2020-12-23 DIAGNOSIS — G43009 Migraine without aura, not intractable, without status migrainosus: Secondary | ICD-10-CM

## 2020-12-23 DIAGNOSIS — Z3A19 19 weeks gestation of pregnancy: Secondary | ICD-10-CM

## 2020-12-23 DIAGNOSIS — F4323 Adjustment disorder with mixed anxiety and depressed mood: Secondary | ICD-10-CM

## 2020-12-23 DIAGNOSIS — O99891 Other specified diseases and conditions complicating pregnancy: Secondary | ICD-10-CM

## 2020-12-23 DIAGNOSIS — O99342 Other mental disorders complicating pregnancy, second trimester: Secondary | ICD-10-CM

## 2020-12-23 DIAGNOSIS — A749 Chlamydial infection, unspecified: Secondary | ICD-10-CM

## 2020-12-23 DIAGNOSIS — Z3480 Encounter for supervision of other normal pregnancy, unspecified trimester: Secondary | ICD-10-CM | POA: Diagnosis not present

## 2020-12-23 DIAGNOSIS — O98312 Other infections with a predominantly sexual mode of transmission complicating pregnancy, second trimester: Secondary | ICD-10-CM

## 2020-12-23 NOTE — Progress Notes (Signed)
+   Fetal movement. No complaints.  

## 2020-12-23 NOTE — Progress Notes (Signed)
   OBSTETRICS PRENATAL VIRTUAL VISIT ENCOUNTER NOTE  Provider location: Center for [facility] [place]   I connected with [name] on 12/23/20 at  8:55 AM EST by MyChart Video Encounter at home and verified that I am speaking with the correct person using two identifiers.   I discussed the limitations, risks, security and privacy concerns of performing an evaluation and management service virtually and the availability of in person appointments. I also discussed with the patient that there may be a patient responsible charge related to this service. The patient expressed understanding and agreed to proceed. Subjective:  Stefanie King is a 20 y.o. G2P1001 at [redacted]w[redacted]d being seen today for ongoing prenatal care.  She is currently monitored for the following issues for this low-risk pregnancy and has Acne vulgaris; Disordered eating; Migraine without aura and without status migrainosus, not intractable; Adjustment disorder with mixed anxiety and depressed mood; Marijuana abuse; Severe recurrent major depression without psychotic features (HCC); Chlamydia infection affecting pregnancy; Supervision of other normal pregnancy, antepartum; and History of gestational hypertension on their problem list.  Patient reports no complaints.  Contractions: Not present. Vag. Bleeding: None.  Movement: Present. Denies any leaking of fluid.   The following portions of the patient's history were reviewed and updated as appropriate: allergies, current medications, past family history, past medical history, past social history, past surgical history and problem list.   Objective:   Vitals:   12/23/20 0810  BP: 121/69  Pulse: 73  Weight: 118 lb (53.5 kg)    Fetal Status:     Movement: Present     General:  Alert, oriented and cooperative. [name] in no acute distress.  Respiratory: Normal respiratory effort, no problems with respiration noted  Mental Status: Normal mood and affect. Normal behavior.  Normal judgment and thought content.  Rest of physical exam deferred due to type of encounter  Imaging: No results found.  Assessment and Plan:  Pregnancy: G2P1001 at [redacted]w[redacted]d 1. Supervision of other normal pregnancy, antepartum --Pt reports good fetal movement, denies cramping, LOF, or vaginal bleeding --Anticipatory guidance about next visits/weeks of pregnancy given. --next visit in 6 weeks for GTT  2. Chlamydia infection affecting pregnancy, antepartum --TOC neg  3. [redacted] weeks gestation of pregnancy --Anatomy US today at 2:45 pm  Preterm labor symptoms and general obstetric precautions including but not limited to vaginal bleeding, contractions, leaking of fluid and fetal movement were reviewed in detail with the patient. I discussed the assessment and treatment plan with the patient. The patient was provided an opportunity to ask questions and all were answered. The patient agreed with the plan and demonstrated an understanding of the instructions. The patient was advised to call back or seek an in-person office evaluation/go to MAU at [facility] for any urgent or concerning symptoms. Please refer to After Visit Summary for other counseling recommendations.   I provided 5 minutes of face-to-face time during this encounter.  Return in about 6 weeks (around 02/03/2021).  Future Appointments  Date Time Provider Department Center  12/23/2020  8:55 AM Leftwich-Kirby, Wilmer Floor, CNM [facility] None  12/23/2020  2:45 PM [facility] US4 [facility]    Sharen Counter, CNM Center for [employer], [facility]

## 2020-12-30 DIAGNOSIS — I951 Orthostatic hypotension: Secondary | ICD-10-CM | POA: Diagnosis not present

## 2021-02-03 ENCOUNTER — Other Ambulatory Visit: Payer: Medicaid Other

## 2021-02-03 ENCOUNTER — Encounter: Payer: Medicaid Other | Admitting: Obstetrics and Gynecology

## 2021-02-05 DIAGNOSIS — K219 Gastro-esophageal reflux disease without esophagitis: Secondary | ICD-10-CM | POA: Diagnosis not present

## 2021-02-05 DIAGNOSIS — R760 Raised antibody titer: Secondary | ICD-10-CM | POA: Diagnosis not present

## 2021-02-12 ENCOUNTER — Other Ambulatory Visit: Payer: Self-pay

## 2021-02-12 ENCOUNTER — Other Ambulatory Visit: Payer: Medicaid Other

## 2021-02-12 ENCOUNTER — Ambulatory Visit (INDEPENDENT_AMBULATORY_CARE_PROVIDER_SITE_OTHER): Payer: Medicaid Other | Admitting: Obstetrics & Gynecology

## 2021-02-12 VITALS — BP 113/69 | HR 87 | Wt 130.0 lb

## 2021-02-12 DIAGNOSIS — Z3480 Encounter for supervision of other normal pregnancy, unspecified trimester: Secondary | ICD-10-CM | POA: Diagnosis not present

## 2021-02-12 DIAGNOSIS — Z23 Encounter for immunization: Secondary | ICD-10-CM | POA: Diagnosis not present

## 2021-02-12 DIAGNOSIS — Z8759 Personal history of other complications of pregnancy, childbirth and the puerperium: Secondary | ICD-10-CM

## 2021-02-12 NOTE — Progress Notes (Signed)
+   Fetal movement. No complaints.  

## 2021-02-12 NOTE — Progress Notes (Signed)
   PRENATAL VISIT NOTE  Subjective:  Stefanie King is a 20 y.o. G2P1001 at [redacted]w[redacted]d being seen today for ongoing prenatal care.  She is currently monitored for the following issues for this high-risk pregnancy and has Acne vulgaris; Disordered eating; Migraine without aura and without status migrainosus, not intractable; Adjustment disorder with mixed anxiety and depressed mood; Marijuana abuse; Severe recurrent major depression without psychotic features (HCC); Chlamydia infection affecting pregnancy; Supervision of other normal pregnancy, antepartum; and History of gestational hypertension on their problem list.  Patient reports no complaints.  Contractions: Irritability. Vag. Bleeding: None.  Movement: Present. Denies leaking of fluid.   The following portions of the patient's history were reviewed and updated as appropriate: allergies, current medications, past family history, past medical history, past social history, past surgical history and problem list.   Objective:   Vitals:   02/12/21 0941  BP: 113/69  Pulse: 87  Weight: 130 lb (59 kg)    Fetal Status:     Movement: Present     General:  Alert, oriented and cooperative. [name] in no acute distress.  Skin: Skin is warm and dry. No rash noted.   Cardiovascular: Normal heart rate noted  Respiratory: Normal respiratory effort, no problems with respiration noted  Abdomen: Soft, gravid, appropriate for gestational age.  Pain/Pressure: Absent     Pelvic: Cervical exam deferred        Extremities: Normal range of motion.  Edema: None  Mental Status: Normal mood and affect. Normal behavior. Normal judgment and thought content.   Assessment and Plan:  Pregnancy: G2P1001 at [redacted]w[redacted]d 1. Supervision of other normal pregnancy, antepartum Doing well  2. History of gestational hypertension Normal BP  Preterm labor symptoms and general obstetric precautions including but not limited to vaginal bleeding, contractions, leaking of fluid  and fetal movement were reviewed in detail with the patient. Please refer to After Visit Summary for other counseling recommendations.   Return in about 4 weeks (around 03/12/2021).  Future Appointments  Date Time Provider Department Center  02/12/2021 10:30 AM Adam Phenix, MD [facility] None    Scheryl Darter, MD

## 2021-02-12 NOTE — Patient Instructions (Signed)

## 2021-02-13 LAB — CBC
Hematocrit: 35.7 % (ref 34.0–46.6)
Hemoglobin: 12.1 g/dL (ref 11.1–15.9)
MCH: 31.4 pg (ref 26.6–33.0)
MCHC: 33.9 g/dL (ref 31.5–35.7)
MCV: 93 fL (ref 79–97)
Platelets: 283 10*3/uL (ref 150–450)
RBC: 3.85 x10E6/uL (ref 3.77–5.28)
RDW: 12.2 % (ref 11.7–15.4)
WBC: 11.5 10*3/uL — ABNORMAL HIGH (ref 3.4–10.8)

## 2021-02-13 LAB — RPR: RPR Ser Ql: NONREACTIVE

## 2021-02-13 LAB — GLUCOSE TOLERANCE, 2 HOURS W/ 1HR
Glucose, 1 hour: 69 mg/dL (ref 65–179)
Glucose, 2 hour: 64 mg/dL — ABNORMAL LOW (ref 65–152)
Glucose, Fasting: 74 mg/dL (ref 65–91)

## 2021-02-13 LAB — HIV ANTIBODY (ROUTINE TESTING W REFLEX): HIV Screen 4th Generation wRfx: NONREACTIVE

## 2021-03-04 ENCOUNTER — Encounter (HOSPITAL_COMMUNITY): Payer: Self-pay | Admitting: Obstetrics and Gynecology

## 2021-03-04 ENCOUNTER — Inpatient Hospital Stay (HOSPITAL_COMMUNITY)
Admission: AD | Admit: 2021-03-04 | Discharge: 2021-03-05 | Disposition: A | Discharge status: Home / Self Care | Payer: Medicaid Other | Attending: Obstetrics and Gynecology | Admitting: Obstetrics and Gynecology

## 2021-03-04 ENCOUNTER — Other Ambulatory Visit: Payer: Self-pay

## 2021-03-04 DIAGNOSIS — O99613 Diseases of the digestive system complicating pregnancy, third trimester: Secondary | ICD-10-CM | POA: Diagnosis not present

## 2021-03-04 DIAGNOSIS — O98819 Other maternal infectious and parasitic diseases complicating pregnancy, unspecified trimester: Secondary | ICD-10-CM

## 2021-03-04 DIAGNOSIS — R112 Nausea with vomiting, unspecified: Secondary | ICD-10-CM

## 2021-03-04 DIAGNOSIS — O212 Late vomiting of pregnancy: Secondary | ICD-10-CM | POA: Insufficient documentation

## 2021-03-04 DIAGNOSIS — K219 Gastro-esophageal reflux disease without esophagitis: Secondary | ICD-10-CM | POA: Insufficient documentation

## 2021-03-04 DIAGNOSIS — Z3480 Encounter for supervision of other normal pregnancy, unspecified trimester: Secondary | ICD-10-CM

## 2021-03-04 DIAGNOSIS — Z3A30 30 weeks gestation of pregnancy: Secondary | ICD-10-CM | POA: Insufficient documentation

## 2021-03-04 DIAGNOSIS — K2100 Gastro-esophageal reflux disease with esophagitis, without bleeding: Secondary | ICD-10-CM

## 2021-03-04 DIAGNOSIS — A749 Chlamydial infection, unspecified: Secondary | ICD-10-CM

## 2021-03-04 DIAGNOSIS — O219 Vomiting of pregnancy, unspecified: Secondary | ICD-10-CM | POA: Diagnosis not present

## 2021-03-04 LAB — URINALYSIS, ROUTINE W REFLEX MICROSCOPIC
Bilirubin Urine: NEGATIVE
Glucose, UA: NEGATIVE mg/dL
Hgb urine dipstick: NEGATIVE
Ketones, ur: 20 mg/dL — AB
Nitrite: NEGATIVE
Protein, ur: NEGATIVE mg/dL
Specific Gravity, Urine: 1.011 (ref 1.005–1.030)
pH: 7 (ref 5.0–8.0)

## 2021-03-04 MED ORDER — SODIUM CHLORIDE 0.9 % IV SOLN: Freq: Once | INTRAVENOUS | Status: AC

## 2021-03-04 MED ORDER — LACTATED RINGERS IV SOLN: Freq: Once | INTRAVENOUS | Status: DC

## 2021-03-04 MED ORDER — PANTOPRAZOLE SODIUM 20 MG PO TBEC
20.0000 mg | DELAYED_RELEASE_TABLET | Freq: Every day | ORAL | Status: AC
  Administered 2021-03-04: 20 mg via ORAL
  Filled 2021-03-04: qty 1

## 2021-03-04 MED ORDER — PANTOPRAZOLE SODIUM 20 MG PO TBEC: 20.0000 mg | DELAYED_RELEASE_TABLET | Freq: Every day | ORAL | Status: DC

## 2021-03-04 MED ORDER — ONDANSETRON 4 MG PO TBDP
4.0000 mg | ORAL_TABLET | Freq: Once | ORAL | Status: AC
  Administered 2021-03-04: 4 mg via ORAL
  Filled 2021-03-04: qty 1

## 2021-03-04 MED ORDER — SODIUM CHLORIDE 0.9 % IV SOLN
2.0000 g | Freq: Once | INTRAVENOUS | Status: AC
  Administered 2021-03-04: 2 g via INTRAVENOUS
  Filled 2021-03-04: qty 2

## 2021-03-04 MED ORDER — METOCLOPRAMIDE HCL 10 MG PO TABS
10.0000 mg | ORAL_TABLET | Freq: Once | ORAL | Status: AC
  Administered 2021-03-04: 10 mg via ORAL
  Filled 2021-03-04: qty 1

## 2021-03-04 NOTE — MAU Provider Note (Signed)
Chief Complaint:  Abdominal Pain (UPPER ABD PAIN )   Event Date/Time   First Provider Initiated Contact with Patient 03/04/21 2222     HPI: Stefanie King is a 20 y.o. G2P1001 at 70w0dho presents to maternity admissions reporting epigastric burning.  Has been vomiting, states because of burning.  Has seen GI doctor and was told she had H Pylori but cannnot be treated while pregnant.  Was only treated with TUMS.  Has not been prescribed PPI. .Marland KitchenShe reports good fetal movement, denies LOF, vaginal bleeding, vaginal itching/burning, urinary symptoms, h/a, dizziness, n/v, diarrhea, constipation or fever/chills.  Abdominal Pain This is a recurrent problem. The problem occurs intermittently. The problem has been unchanged. The pain is located in the epigastric region. The quality of the pain is burning. The abdominal pain does not radiate. Associated symptoms include vomiting. Pertinent negatives include no anorexia, constipation, diarrhea, dysuria, fever, frequency or myalgias. The pain is aggravated by vomiting. The pain is relieved by nothing. She has tried antacids for the symptoms. The treatment provided no relief. Prior diagnostic workup includes GI consult. Her past medical history is significant for PUD (was told she had H Pylori).    RN Note: PT SAYS HAS ACID REFLUX- HER GI DR TOLD HER IN MARCH THAT SHE HAS A STOMACH INFECTION- UNTREATABLE UNTIL  DEL.  FEELS FIRE IN CHEST ALL TIME - SHE TAKES TUMS AND VOMIT  LAST SEEN IN MARCH -   GI DR CALLED OB DR - TUMS AND ROLAIDS .   HAS VOMITED ALL DAY . HAS HIGH BP -PRE-PREG- NO MEDS. NO H/A, NO BLURRED VISION, NO  EPIGASTRIC PAIN - TOOK BP AT HOME AND WAS HIGH .  APPOINTMENT NEXT WEEK WITH [facility] ON Sunday- STARTED LOWER ABD PAIN -DID    Past Medical History: Past Medical History:  Diagnosis Date  . Anxiety   . Disordered eating 12/25/2015  . Hypertension   . Migraines   . Pregnancy induced hypertension     Past obstetric history: OB  History  Gravida Para Term Preterm AB Living  _0 SAB IAB Ectopic Multiple Live Births        0 1    # Outcome Date GA Lbr Len/2nd Weight Sex Delivery Anes PTL Lv  2 Current           1 Term 12/22/18 350w4d4:50 / 00:22 3055 g M Vag-Spont EPI  LIV    Past Surgical History: Past Surgical History:  Procedure Laterality Date  . CHOLECYSTECTOMY N/A 10/17/2014   Procedure: LAPAROSCOPIC CHOLECYSTECTOMY WITHOUT INTRAOPERATIVE CHOLANGIOGRAM;  Surgeon: M.Jerilynn MagesShGerald StabsMD;  [facility];  Laterality: N/A;  laparoscopic cholecystectomy  . CHOLECYSTECTOMY  2015  . DENTAL SURGERY      Family History: Family History  Problem Relation Age of Onset  . Alcohol abuse Father   . Hypertension Father   . Cancer Paternal Grandmother   . Asthma Neg Hx   . Depression Neg Hx   . Diabetes Neg Hx   . Drug abuse Neg Hx   . Early death Neg Hx   . Hearing loss Neg Hx   . Heart disease Neg Hx   . Hyperlipidemia Neg Hx   . Kidney disease Neg Hx   . Mental illness Neg Hx   . Mental retardation Neg Hx   . Stroke Neg Hx     Social History: Social History   Tobacco Use  . Smoking  status: Never Smoker  . Smokeless tobacco: Never Used  . Tobacco comment: last marijuana smoked 06-25-18  Vaping Use  . Vaping Use: Never used  Substance Use Topics  . Alcohol use: No    Alcohol/week: 0.0 standard drinks  . Drug use: Not Currently    Types: Marijuana    Comment: 01-2021    Allergies: No Known Allergies  Meds:  Medications Prior to Admission  Medication Sig Dispense Refill Last Dose  . Prenatal Vit-Fe Fumarate-FA (PRENATAL VITAMIN) 27-0.8 MG TABS Take 1 tablet by mouth daily. 90 tablet 3 03/04/2021 at Unknown time  . acetaminophen (TYLENOL) 325 MG tablet Take 2 tablets (650 mg total) by mouth every 4 (four) hours as needed (for pain scale < 4). (Patient not taking: No sig reported) 30 tablet 0   . aspirin EC 81 MG tablet Take 1 tablet (81 mg total) by mouth daily.  Swallow whole. 30 tablet 11 02/18/2021  . Blood Pressure Monitoring (BLOOD PRESSURE KIT) DEVI 1 kit by Does not apply route once a week. Check Blood Pressure regularly and record readings into the [employer] App.  Large Cuff.  DX O90.0 1 each 0   . ibuprofen (ADVIL,MOTRIN) 600 MG tablet Take 1 tablet (600 mg total) by mouth every 6 (six) hours. (Patient not taking: No sig reported) 30 tablet 0   . metroNIDAZOLE (FLAGYL) 500 MG tablet Take 1 tablet (500 mg total) by mouth 2 (two) times daily. (Patient not taking: No sig reported) 14 tablet 0   . ondansetron (ZOFRAN-ODT) 4 MG disintegrating tablet Take by mouth. (Patient not taking: No sig reported)       I have reviewed patient's Past Medical Hx, Surgical Hx, Family Hx, Social Hx, medications and allergies.   ROS:  Review of Systems  Constitutional: Negative for fever.  Gastrointestinal: Positive for abdominal pain and vomiting. Negative for anorexia, constipation and diarrhea.  Genitourinary: Negative for dysuria and frequency.  Musculoskeletal: Negative for myalgias.   Other systems negative  Physical Exam   Patient Vitals for the past 24 hrs:  BP Temp Temp src Pulse Resp Height Weight  03/04/21 2200 115/75 -- -- 99 -- -- --  03/04/21 2144 119/78 97.9 F (36.6 C) Oral (!) 109 18 _0  (1.6 m) 60.6 kg   Constitutional: Well-developed, well-nourished female in no acute distress.  Cardiovascular: normal rate and rhythm Respiratory: normal effort, clear to auscultation bilaterally GI: Abd soft, non-tender except over epigastrum which is mildly tender, gravid appropriate for gestational age.   No rebound or guarding. MS: Extremities nontender, no edema, normal ROM Neurologic: Alert and oriented x 4.  GU: Neg CVAT.   FHT:  Baseline 145 , moderate variability, accelerations present, no decelerations Contractions: Occasional irritability   Labs: Results for orders placed or performed during the hospital encounter of 03/04/21 (from the  past 24 hour(s))  Urinalysis, Routine w reflex microscopic Urine, Clean Catch     Status: Abnormal   Collection Time: 03/04/21  9:56 PM  Result Value Ref Range   Color, Urine YELLOW YELLOW   APPearance HAZY (A) CLEAR   Specific Gravity, Urine 1.011 1.005 - 1.030   pH 7.0 5.0 - 8.0   Glucose, UA NEGATIVE NEGATIVE mg/dL   Hgb urine dipstick NEGATIVE NEGATIVE   Bilirubin Urine NEGATIVE NEGATIVE   Ketones, ur 20 (A) NEGATIVE mg/dL   Protein, ur NEGATIVE NEGATIVE mg/dL   Nitrite NEGATIVE NEGATIVE   Leukocytes,Ua LARGE (A) NEGATIVE   RBC / HPF 0-5 0 - 5  RBC/hpf   WBC, UA 11-20 0 - 5 WBC/hpf   Bacteria, UA MANY (A) NONE SEEN   Squamous Epithelial / LPF 0-5 0 - 5   A/Positive/-- (12/13 1414)  Imaging:  No results found.  MAU Course/MDM: I have ordered labs and reviewed results. UA is suspicious for UTI (sent for culture) NST reviewed, reactive   Treatments in MAU included Zofran given first for nausea.  Then we gave Reglan to aid in gastric emptying and Protonix to aid in acid reduction.  Then we gave her a GI cocktail for comfort.  She got excellent relief with these interventions.  SInce her urine was suspicious for infection, we gave her a dose of Rocephin 2gm for presumed UTI. Marland Kitchen    Assessment: SIngle IUP at 33w1dGERD Vomiting Presumed UTI  Plan: Discharge home Rx Reglan for gastric emptying  Rx Protonix for GERD Rx Keflex for UTI  Follow up in Office for prenatal visits  Encouraged to return if she develops worsening of symptoms, increase in pain, fever, or other concerning symptoms.   Pt stable at time of discharge.  MHansel FeinsteinCNM, MSN Certified Nurse-Midwife 03/04/2021 10:22 PM

## 2021-03-04 NOTE — MAU Note (Addendum)
PT SAYS HAS ACID REFLUX- HER GI DR TOLD HER IN MARCH THAT SHE HAS A STOMACH INFECTION- UNTREATABLE UNTIL  DEL.  FEELS FIRE IN CHEST ALL TIME - SHE TAKES TUMS AND VOMIT  LAST SEEN IN MARCH -   GI DR CALLED OB DR - TUMS AND ROLAIDS .   HAS VOMITED ALL DAY . HAS HIGH BP -PRE-PREG- NO MEDS. NO H/A, NO BLURRED VISION, NO  EPIGASTRIC PAIN - TOOK BP AT HOME AND WAS HIGH .  APPOINTMENT NEXT WEEK WITH [facility]  ON Sunday- STARTED LOWER ABD PAIN -DID NOT CALL DR.

## 2021-03-05 ENCOUNTER — Other Ambulatory Visit: Payer: Self-pay | Admitting: Advanced Practice Midwife

## 2021-03-05 DIAGNOSIS — O99613 Diseases of the digestive system complicating pregnancy, third trimester: Secondary | ICD-10-CM

## 2021-03-05 DIAGNOSIS — K219 Gastro-esophageal reflux disease without esophagitis: Secondary | ICD-10-CM

## 2021-03-05 DIAGNOSIS — Z3A30 30 weeks gestation of pregnancy: Secondary | ICD-10-CM | POA: Diagnosis not present

## 2021-03-05 DIAGNOSIS — O219 Vomiting of pregnancy, unspecified: Secondary | ICD-10-CM

## 2021-03-05 MED ORDER — CEPHALEXIN 500 MG PO CAPS: 500.0000 mg | ORAL_CAPSULE | Freq: Four times a day (QID) | ORAL | 2 refills | Status: DC

## 2021-03-05 MED ORDER — PANTOPRAZOLE SODIUM 20 MG PO TBEC: 20.0000 mg | DELAYED_RELEASE_TABLET | Freq: Every day | ORAL | 0 refills | Status: DC

## 2021-03-05 MED ORDER — ALUM & MAG HYDROXIDE-SIMETH 200-200-20 MG/5ML PO SUSP
30.0000 mL | Freq: Once | ORAL | Status: AC
  Administered 2021-03-05: 30 mL via ORAL
  Filled 2021-03-05: qty 30

## 2021-03-05 MED ORDER — LIDOCAINE VISCOUS HCL 2 % MT SOLN
15.0000 mL | Freq: Once | OROMUCOSAL | Status: AC
  Administered 2021-03-05: 15 mL via ORAL
  Filled 2021-03-05: qty 15

## 2021-03-05 MED ORDER — METOCLOPRAMIDE HCL 5 MG PO TABS: 5.0000 mg | ORAL_TABLET | Freq: Three times a day (TID) | ORAL | 0 refills | Status: DC | PRN

## 2021-03-05 NOTE — Discharge Instructions (Signed)
Gastroesophageal Reflux Disease, Adult  Gastroesophageal reflux (GER) happens when acid from the stomach flows up into the tube that connects the mouth and the stomach (esophagus). Normally, food travels down the esophagus and stays in the stomach to be digested. With GER, food and stomach acid sometimes move back up into the esophagus. You may have a disease called gastroesophageal reflux disease (GERD) if the reflux:  Happens often.  Causes frequent or very bad symptoms.  Causes problems such as damage to the esophagus. When this happens, the esophagus becomes sore and swollen. Over time, GERD can make small holes (ulcers) in the lining of the esophagus. What are the causes? This condition is caused by a problem with the muscle between the esophagus and the stomach. When this muscle is weak or not normal, it does not close properly to keep food and acid from coming back up from the stomach. The muscle can be weak because of:  Tobacco use.  Pregnancy.  Having a certain type of hernia (hiatal hernia).  Alcohol use.  Certain foods and drinks, such as coffee, chocolate, onions, and peppermint. What increases the risk?  Being overweight.  Having a disease that affects your connective tissue.  Taking NSAIDs, such a ibuprofen. What are the signs or symptoms?  Heartburn.  Difficult or painful swallowing.  The feeling of having a lump in the throat.  A bitter taste in the mouth.  Bad breath.  Having a lot of saliva.  Having an upset or bloated stomach.  Burping.  Chest pain. Different conditions can cause chest pain. Make sure you see your doctor if you have chest pain.  Shortness of breath or wheezing.  A long-term cough or a cough at night.  Wearing away of the surface of teeth (tooth enamel).  Weight loss. How is this treated?  Making changes to your diet.  Taking medicine.  Having surgery. Treatment will depend on how bad your symptoms are. Follow these  instructions at home: Eating and drinking  Follow a diet as told by your doctor. You may need to avoid foods and drinks such as: ? Coffee and tea, with or without caffeine. ? Drinks that contain alcohol. ? Energy drinks and sports drinks. ? Bubbly (carbonated) drinks or sodas. ? Chocolate and cocoa. ? Peppermint and mint flavorings. ? Garlic and onions. ? Horseradish. ? Spicy and acidic foods. These include peppers, chili powder, curry powder, vinegar, hot sauces, and BBQ sauce. ? Citrus fruit juices and citrus fruits, such as oranges, lemons, and limes. ? Tomato-based foods. These include red sauce, chili, salsa, and pizza with red sauce. ? Fried and fatty foods. These include donuts, french fries, potato chips, and high-fat dressings. ? High-fat meats. These include hot dogs, rib eye steak, sausage, ham, and bacon. ? High-fat dairy items, such as whole milk, butter, and cream cheese.  Eat small meals often. Avoid eating large meals.  Avoid drinking large amounts of liquid with your meals.  Avoid eating meals during the 2-3 hours before bedtime.  Avoid lying down right after you eat.  Do not exercise right after you eat.   Lifestyle  Do not smoke or use any products that contain nicotine or tobacco. If you need help quitting, ask your doctor.  Try to lower your stress. If you need help doing this, ask your doctor.  If you are overweight, lose an amount of weight that is healthy for you. Ask your doctor about a safe weight loss goal.   General instructions    Pay attention to any changes in your symptoms.  Take over-the-counter and prescription medicines only as told by your doctor.  Do not take aspirin, ibuprofen, or other NSAIDs unless your doctor says it is okay.  Wear loose clothes. Do not wear anything tight around your waist.  Raise (elevate) the head of your bed about 6 inches (15 cm). You may need to use a wedge to do this.  Avoid bending over if this makes your  symptoms worse.  Keep all follow-up visits. Contact a doctor if:  You have new symptoms.  You lose weight and you do not know why.  You have trouble swallowing or it hurts to swallow.  You have wheezing or a cough that keeps happening.  You have a hoarse voice.  Your symptoms do not get better with treatment. Get help right away if:  You have sudden pain in your arms, neck, jaw, teeth, or back.  You suddenly feel sweaty, dizzy, or light-headed.  You have chest pain or shortness of breath.  You vomit and the vomit is green, yellow, or black, or it looks like blood or coffee grounds.  You faint.  Your poop (stool) is red, bloody, or black.  You cannot swallow, drink, or eat. These symptoms may represent a serious problem that is an emergency. Do not wait to see if the symptoms will go away. Get medical help right away. Call your local emergency services (911 in the [place] Do not drive yourself to the hospital. Summary  If a person has gastroesophageal reflux disease (GERD), food and stomach acid move back up into the esophagus and cause symptoms or problems such as damage to the esophagus.  Treatment will depend on how bad your symptoms are.  Follow a diet as told by your doctor.  Take all medicines only as told by your doctor. This information is not intended to replace advice given to you by your health care provider. Make sure you discuss any questions you have with your health care provider. Document Revised: 05/13/2020 Document Reviewed: 05/13/2020 Elsevier Patient Education  2021 [employer]vier Inc.  

## 2021-03-05 NOTE — Progress Notes (Signed)
Presumed UTI Rx Keflex

## 2021-03-06 LAB — CULTURE, OB URINE: Culture: 10000 — AB

## 2021-03-10 ENCOUNTER — Ambulatory Visit (INDEPENDENT_AMBULATORY_CARE_PROVIDER_SITE_OTHER): Payer: Medicaid Other | Admitting: Advanced Practice Midwife

## 2021-03-10 ENCOUNTER — Other Ambulatory Visit: Payer: Self-pay

## 2021-03-10 VITALS — BP 98/67 | HR 99 | Wt 135.0 lb

## 2021-03-10 DIAGNOSIS — Z3480 Encounter for supervision of other normal pregnancy, unspecified trimester: Secondary | ICD-10-CM

## 2021-03-10 DIAGNOSIS — Z3A30 30 weeks gestation of pregnancy: Secondary | ICD-10-CM

## 2021-03-10 DIAGNOSIS — R112 Nausea with vomiting, unspecified: Secondary | ICD-10-CM

## 2021-03-10 DIAGNOSIS — K2100 Gastro-esophageal reflux disease with esophagitis, without bleeding: Secondary | ICD-10-CM

## 2021-03-10 NOTE — Progress Notes (Signed)
   PRENATAL VISIT NOTE  Subjective:  Stefanie King is a 20 y.o. G2P1001 at [redacted]w[redacted]d being seen today for ongoing prenatal care.  She is currently monitored for the following issues for this low-risk pregnancy and has Acne vulgaris; GERD (gastroesophageal reflux disease); Disordered eating; Migraine without aura and without status migrainosus, not intractable; Adjustment disorder with mixed anxiety and depressed mood; Marijuana abuse; Severe recurrent major depression without psychotic features (HCC); Chlamydia infection affecting pregnancy; Supervision of other normal pregnancy, antepartum; and History of gestational hypertension on their problem list.  Patient reports improved n/v since her MAU visit 4/19.  Contractions: Irritability. Vag. Bleeding: None.  Movement: Present. Denies leaking of fluid.   The following portions of the patient's history were reviewed and updated as appropriate: allergies, current medications, past family history, past medical history, past social history, past surgical history and problem list.   Objective:   Vitals:   03/10/21 1051  BP: 98/67  Pulse: 99  Weight: 135 lb (61.2 kg)    Fetal Status: Fetal Heart Rate (bpm): 152   Movement: Present     General:  Alert, oriented and cooperative. [name] in no acute distress.  Skin: Skin is warm and dry. No rash noted.   Cardiovascular: Normal heart rate noted  Respiratory: Normal respiratory effort, no problems with respiration noted  Abdomen: Soft, gravid, appropriate for gestational age.  Pain/Pressure: Absent     Pelvic: Cervical exam deferred        Extremities: Normal range of motion.  Edema: None  Mental Status: Normal mood and affect. Normal behavior. Normal judgment and thought content.   Assessment and Plan:  Pregnancy: G2P1001 at [redacted]w[redacted]d 1. Supervision of other normal pregnancy, antepartum --Anticipatory guidance about next visits/weeks of pregnancy given. --Next visit in 2 weeks  2. [redacted] weeks  gestation of pregnancy   3. Nausea and vomiting, intractability of vomiting not specified, unspecified vomiting type --Pt does smoke THC, reports it is not daily and she has not increased THC recently related to increase in n/v.  Medications given in MAU and prescribed on 4/19 have helped with her symptoms.  --She is taking daily Protonix, and Reglan Q 6 hours, with Zofran for breakthrough symptoms. --Continue current regimen.  May increase Protonix or add another medication if needed.  4. Gastroesophageal reflux disease with esophagitis without hemorrhage --Taking daily Protonix  Preterm labor symptoms and general obstetric precautions including but not limited to vaginal bleeding, contractions, leaking of fluid and fetal movement were reviewed in detail with the patient. Please refer to After Visit Summary for other counseling recommendations.   Return in about 2 weeks (around 03/24/2021).  Future Appointments  Date Time Provider Department Center  03/25/2021 10:15 AM Leftwich-Kirby, Wilmer Floor, CNM [facility] None    Sharen Counter, CNM

## 2021-03-10 NOTE — Patient Instructions (Signed)
Food Choices for Gastroesophageal Reflux Disease, Adult When you have gastroesophageal reflux disease (GERD), the foods you eat and your eating habits are very important. Choosing the right foods can help ease the discomfort of GERD. Consider working with a dietitian to help you make healthy food choices. What are tips for following this plan? Reading food labels  Look for foods that are low in saturated fat. Foods that have less than 5% of daily value (DV) of fat and 0 g of trans fats may help with your symptoms. Cooking  Cook foods using methods other than frying. This may include baking, steaming, grilling, or broiling. These are all methods that do not need a lot of fat for cooking.  To add flavor, try to use herbs that are low in spice and acidity. Meal planning  Choose healthy foods that are low in fat, such as fruits, vegetables, whole grains, low-fat dairy products, lean meats, fish, and poultry.  Eat frequent, small meals instead of three large meals each day. Eat your meals slowly, in a relaxed setting. Avoid bending over or lying down until 2-3 hours after eating.  Limit high-fat foods such as fatty meats or fried foods.  Limit your intake of fatty foods, such as oils, butter, and shortening.  Avoid the following as told by your health care provider: ? Foods that cause symptoms. These may be different for different people. Keep a food diary to keep track of foods that cause symptoms. ? Alcohol. ? Drinking large amounts of liquid with meals. ? Eating meals during the 2-3 hours before bed.   Lifestyle  Maintain a healthy weight. Ask your health care provider what weight is healthy for you. If you need to lose weight, work with your health care provider to do so safely.  Exercise for at least 30 minutes on 5 or more days each week, or as told by your health care provider.  Avoid wearing clothes that fit tightly around your waist and chest.  Do not use any products that  contain nicotine or tobacco. These products include cigarettes, chewing tobacco, and vaping devices, such as e-cigarettes. If you need help quitting, ask your health care provider.  Sleep with the head of your bed raised. Use a wedge under the mattress or blocks under the bed frame to raise the head of the bed.  Chew sugar-free gum after mealtimes. What foods should I eat? Eat a healthy, well-balanced diet of fruits, vegetables, whole grains, low-fat dairy products, lean meats, fish, and poultry. Each person is different. Foods that may trigger symptoms in one person may not trigger any symptoms in another person. Work with your health care provider to identify foods that are safe for you. The items listed above may not be a complete list of recommended foods and beverages. Contact a dietitian for more information.   What foods should I avoid? Limiting some of these foods may help manage the symptoms of GERD. Everyone is different. Consult a dietitian or your health care provider to help you identify the exact foods to avoid, if any. Fruits Any fruits prepared with added fat. Any fruits that cause symptoms. For some people this may include citrus fruits, such as oranges, grapefruit, pineapple, and lemons. Vegetables Deep-fried vegetables. French fries. Any vegetables prepared with added fat. Any vegetables that cause symptoms. For some people, this may include tomatoes and tomato products, chili peppers, onions and garlic, and horseradish. Grains Pastries or quick breads with added fat. Meats and other proteins High-fat   meats, such as fatty beef or pork, hot dogs, ribs, ham, sausage, salami, and bacon. Fried meat or protein, including fried fish and fried chicken. Nuts and nut butters, in large amounts. Dairy Whole milk and chocolate milk. Sour cream. Cream. Ice cream. Cream cheese. Milkshakes. Fats and oils Butter. Margarine. Shortening. Ghee. Beverages Coffee and tea, with or without  caffeine. Carbonated beverages. Sodas. Energy drinks. Fruit juice made with acidic fruits, such as orange or grapefruit. Tomato juice. Alcoholic drinks. Sweets and desserts Chocolate and cocoa. Donuts. Seasonings and condiments Pepper. Peppermint and spearmint. Added salt. Any condiments, herbs, or seasonings that cause symptoms. For some people, this may include curry, hot sauce, or vinegar-based salad dressings. The items listed above may not be a complete list of foods and beverages to avoid. Contact a dietitian for more information. Questions to ask your health care provider Diet and lifestyle changes are usually the first steps that are taken to manage symptoms of GERD. If diet and lifestyle changes do not improve your symptoms, talk with your health care provider about taking medicines. Where to find more information  International Foundation for Gastrointestinal Disorders: aboutgerd.org Summary  When you have gastroesophageal reflux disease (GERD), food and lifestyle choices may be very helpful in easing the discomfort of GERD.  Eat frequent, small meals instead of three large meals each day. Eat your meals slowly, in a relaxed setting. Avoid bending over or lying down until 2-3 hours after eating.  Limit high-fat foods such as fatty meats or fried foods. This information is not intended to replace advice given to you by your health care provider. Make sure you discuss any questions you have with your health care provider. Document Revised: 05/13/2020 Document Reviewed: 05/13/2020 Elsevier Patient Education  2021 Elsevier Inc.  

## 2021-03-25 ENCOUNTER — Other Ambulatory Visit: Payer: Self-pay

## 2021-03-25 ENCOUNTER — Ambulatory Visit (INDEPENDENT_AMBULATORY_CARE_PROVIDER_SITE_OTHER): Payer: Medicaid Other | Admitting: Advanced Practice Midwife

## 2021-03-25 VITALS — BP 122/75 | HR 85 | Wt 139.0 lb

## 2021-03-25 DIAGNOSIS — O26893 Other specified pregnancy related conditions, third trimester: Secondary | ICD-10-CM

## 2021-03-25 DIAGNOSIS — O219 Vomiting of pregnancy, unspecified: Secondary | ICD-10-CM

## 2021-03-25 DIAGNOSIS — R12 Heartburn: Secondary | ICD-10-CM

## 2021-03-25 DIAGNOSIS — Z8759 Personal history of other complications of pregnancy, childbirth and the puerperium: Secondary | ICD-10-CM

## 2021-03-25 DIAGNOSIS — Z3480 Encounter for supervision of other normal pregnancy, unspecified trimester: Secondary | ICD-10-CM

## 2021-03-25 DIAGNOSIS — Z3A33 33 weeks gestation of pregnancy: Secondary | ICD-10-CM

## 2021-03-25 MED ORDER — METOCLOPRAMIDE HCL 5 MG PO TABS: 5.0000 mg | ORAL_TABLET | Freq: Three times a day (TID) | ORAL | 3 refills | Status: DC | PRN

## 2021-03-25 MED ORDER — ASPIRIN EC 81 MG PO TBEC: 81.0000 mg | DELAYED_RELEASE_TABLET | Freq: Every day | ORAL | 2 refills | Status: DC

## 2021-03-25 MED ORDER — PANTOPRAZOLE SODIUM 20 MG PO TBEC: 20.0000 mg | DELAYED_RELEASE_TABLET | Freq: Every day | ORAL | 3 refills | Status: DC

## 2021-03-25 NOTE — Progress Notes (Signed)
+   Fetal movement. No complaints.  

## 2021-03-25 NOTE — Progress Notes (Signed)
   PRENATAL VISIT NOTE  Subjective:  Stefanie King is a 20 y.o. G2P1001 at [redacted]w[redacted]d being seen today for ongoing prenatal care.  She is currently monitored for the following issues for this low-risk pregnancy and has Acne vulgaris; GERD (gastroesophageal reflux disease); Disordered eating; Migraine without aura and without status migrainosus, not intractable; Adjustment disorder with mixed anxiety and depressed mood; Marijuana abuse; Severe recurrent major depression without psychotic features (HCC); Chlamydia infection affecting pregnancy; Supervision of other normal pregnancy, antepartum; and History of gestational hypertension on their problem list.  Patient reports n/v and heartburn improved, needs refill of meds.  Contractions: Irritability. Vag. Bleeding: None.  Movement: Present. Denies leaking of fluid.   The following portions of the patient's history were reviewed and updated as appropriate: allergies, current medications, past family history, past medical history, past social history, past surgical history and problem list.   Objective:   Vitals:   03/25/21 1018  BP: 122/75  Pulse: 85  Weight: 139 lb (63 kg)    Fetal Status: Fetal Heart Rate (bpm): 141 Fundal Height: 33 cm Movement: Present     General:  Alert, oriented and cooperative. [name] in no acute distress.  Skin: Skin is warm and dry. No rash noted.   Cardiovascular: Normal heart rate noted  Respiratory: Normal respiratory effort, no problems with respiration noted  Abdomen: Soft, gravid, appropriate for gestational age.  Pain/Pressure: Absent     Pelvic: Cervical exam deferred        Extremities: Normal range of motion.  Edema: None  Mental Status: Normal mood and affect. Normal behavior. Normal judgment and thought content.   Assessment and Plan:  Pregnancy: G2P1001 at [redacted]w[redacted]d 1. [redacted] weeks gestation of pregnancy   2. History of gestational hypertension --Renew Rx, take daily until delivery - aspirin EC 81 MG  tablet; Take 1 tablet (81 mg total) by mouth daily. Swallow whole.  Dispense: 30 tablet; Refill: 2  3. Heartburn during pregnancy in third trimester --Pt improved after medications prescribed in [facility] visit 03/04/21.  Needs refills. - pantoprazole (PROTONIX) 20 MG tablet; Take 1 tablet (20 mg total) by mouth daily.  Dispense: 30 tablet; Refill: 3  4. Nausea and vomiting during pregnancy  - metoCLOPramide (REGLAN) 5 MG tablet; Take 1 tablet (5 mg total) by mouth every 8 (eight) hours as needed for up to 30 doses for nausea.  Dispense: 30 tablet; Refill: 3  5. Supervision of other normal pregnancy, antepartum --Anticipatory guidance about next visits/weeks of pregnancy given. --Next visit in 3 weeks for GBS  Preterm labor symptoms and general obstetric precautions including but not limited to vaginal bleeding, contractions, leaking of fluid and fetal movement were reviewed in detail with the patient. Please refer to After Visit Summary for other counseling recommendations.   Return in about 2 weeks (around 04/08/2021).  Future Appointments  Date Time Provider Department Center  04/08/2021  3:45 PM Constant, Gigi Gin, MD [facility] None    Sharen Counter, CNM

## 2021-03-25 NOTE — Patient Instructions (Signed)

## 2021-04-08 ENCOUNTER — Encounter: Payer: Medicaid Other | Admitting: Obstetrics and Gynecology

## 2021-04-15 ENCOUNTER — Other Ambulatory Visit: Payer: Self-pay

## 2021-04-15 ENCOUNTER — Ambulatory Visit (INDEPENDENT_AMBULATORY_CARE_PROVIDER_SITE_OTHER): Payer: Medicaid Other | Admitting: Obstetrics and Gynecology

## 2021-04-15 ENCOUNTER — Other Ambulatory Visit (HOSPITAL_COMMUNITY)
Admission: RE | Admit: 2021-04-15 | Discharge: 2021-04-15 | Disposition: A | Discharge status: Home / Self Care | Payer: Medicaid Other | Source: Ambulatory Visit | Attending: Obstetrics and Gynecology | Admitting: Obstetrics and Gynecology

## 2021-04-15 VITALS — BP 116/71 | HR 81 | Wt 143.0 lb

## 2021-04-15 DIAGNOSIS — B3731 Acute candidiasis of vulva and vagina: Secondary | ICD-10-CM | POA: Insufficient documentation

## 2021-04-15 DIAGNOSIS — Z3480 Encounter for supervision of other normal pregnancy, unspecified trimester: Secondary | ICD-10-CM | POA: Insufficient documentation

## 2021-04-15 DIAGNOSIS — B373 Candidiasis of vulva and vagina: Secondary | ICD-10-CM

## 2021-04-15 MED ORDER — TERCONAZOLE 0.4 % VA CREA: 1.0000 | TOPICAL_CREAM | Freq: Every day | VAGINAL | 0 refills | Status: DC

## 2021-04-15 NOTE — Progress Notes (Signed)
   PRENATAL VISIT NOTE  Subjective:  Stefanie King is a 20 y.o. G2P1001 at [redacted]w[redacted]d being seen today for ongoing prenatal care.  She is currently monitored for the following issues for this low-risk pregnancy and has Acne vulgaris; GERD (gastroesophageal reflux disease); Disordered eating; Migraine without aura and without status migrainosus, not intractable; Adjustment disorder with mixed anxiety and depressed mood; Marijuana abuse; Severe recurrent major depression without psychotic features (HCC); Chlamydia infection affecting pregnancy; Supervision of other normal pregnancy, antepartum; History of gestational hypertension; and Vaginal yeast infection on their problem list.  Patient reports no complaints.  Contractions: Irritability. Vag. Bleeding: None.  Movement: Present. Denies leaking of fluid.   The following portions of the patient's history were reviewed and updated as appropriate: allergies, current medications, past family history, past medical history, past social history, past surgical history and problem list.   Objective:   Vitals:   04/15/21 1546  BP: 116/71  Pulse: 81  Weight: 143 lb (64.9 kg)    Fetal Status:   Fundal Height: 36 cm Movement: Present  Presentation: Vertex  General:  Alert, oriented and cooperative. [name] in no acute distress.  Skin: Skin is warm and dry. No rash noted.   Cardiovascular: Normal heart rate noted  Respiratory: Normal respiratory effort, no problems with respiration noted  Abdomen: Soft, gravid, appropriate for gestational age.  Pain/Pressure: Absent     Pelvic: Cervical exam performed in the presence of a chaperone Dilation: 1 Effacement (%): 50 Station: -3  Extremities: Normal range of motion.  Edema: None  Mental Status: Normal mood and affect. Normal behavior. Normal judgment and thought content.   Assessment and Plan:  Pregnancy: G2P1001 at [redacted]w[redacted]d  1. Supervision of other normal pregnancy, antepartum - GC/Chlamydia probe amp  ([address])not at [facility] - Culture, beta strep (group b only) - Cytology - PAP( [place])  2. Vaginal yeast infection  RX: terazol 7   Preterm labor symptoms and general obstetric precautions including but not limited to vaginal bleeding, contractions, leaking of fluid and fetal movement were reviewed in detail with the patient. Please refer to After Visit Summary for other counseling recommendations.   No follow-ups on file.  Future Appointments  Date Time Provider Department Center  04/21/2021  3:45 PM Conan Bowens, MD [facility] None    Venia Carbon, NP

## 2021-04-17 LAB — GC/CHLAMYDIA PROBE AMP (~~LOC~~) NOT AT ARMC
Chlamydia: NEGATIVE
Comment: NEGATIVE
Comment: NORMAL
Neisseria Gonorrhea: NEGATIVE

## 2021-04-17 LAB — CYTOLOGY - PAP: Diagnosis: NEGATIVE

## 2021-04-19 LAB — CULTURE, BETA STREP (GROUP B ONLY): Strep Gp B Culture: NEGATIVE

## 2021-04-21 ENCOUNTER — Ambulatory Visit (INDEPENDENT_AMBULATORY_CARE_PROVIDER_SITE_OTHER): Payer: Medicaid Other | Admitting: Obstetrics and Gynecology

## 2021-04-21 ENCOUNTER — Encounter: Payer: Self-pay | Admitting: Obstetrics and Gynecology

## 2021-04-21 ENCOUNTER — Other Ambulatory Visit: Payer: Self-pay

## 2021-04-21 VITALS — BP 104/69 | HR 105 | Wt 146.3 lb

## 2021-04-21 DIAGNOSIS — Z3A36 36 weeks gestation of pregnancy: Secondary | ICD-10-CM

## 2021-04-21 DIAGNOSIS — Z3480 Encounter for supervision of other normal pregnancy, unspecified trimester: Secondary | ICD-10-CM | POA: Diagnosis not present

## 2021-04-21 DIAGNOSIS — Z8759 Personal history of other complications of pregnancy, childbirth and the puerperium: Secondary | ICD-10-CM

## 2021-04-21 NOTE — Progress Notes (Signed)
   PRENATAL VISIT NOTE  Subjective:  Stefanie King is a 20 y.o. G2P1001 at [redacted]w[redacted]d being seen today for ongoing prenatal care.  She is currently monitored for the following issues for this low-risk pregnancy and has Acne vulgaris; GERD (gastroesophageal reflux disease); Disordered eating; Migraine without aura and without status migrainosus, not intractable; Adjustment disorder with mixed anxiety and depressed mood; Marijuana abuse; Severe recurrent major depression without psychotic features (HCC); Chlamydia infection affecting pregnancy; Supervision of other normal pregnancy, antepartum; History of gestational hypertension; and Vaginal yeast infection on their problem list.  Patient reports occasional contractions.  Contractions: Irritability. Vag. Bleeding: None.  Movement: Present. Denies leaking of fluid.   The following portions of the patient's history were reviewed and updated as appropriate: allergies, current medications, past family history, past medical history, past social history, past surgical history and problem list.   Objective:   Vitals:   04/21/21 1547  BP: 104/69  Pulse: (!) 105  Weight: 146 lb 4.8 oz (66.4 kg)    Fetal Status: Fetal Heart Rate (bpm): 143   Movement: Present     General:  Alert, oriented and cooperative. [name] in no acute distress.  Skin: Skin is warm and dry. No rash noted.   Cardiovascular: Normal heart rate noted  Respiratory: Normal respiratory effort, no problems with respiration noted  Abdomen: Soft, gravid, appropriate for gestational age.  Pain/Pressure: Absent     Pelvic: Cervical exam deferred        Extremities: Normal range of motion.     Mental Status: Normal mood and affect. Normal behavior. Normal judgment and thought content.   Pt informed that the ultrasound is considered a limited OB ultrasound and is not intended to be a complete ultrasound exam.  Patient also informed that the ultrasound is not being completed with the intent  of assessing for fetal or placental anomalies or any pelvic abnormalities.  Explained that the purpose of today's ultrasound is to assess for  presentation.  Patient acknowledges the purpose of the exam and the limitations of the study.    Bedside US: cephalic   Assessment and Plan:  Pregnancy: G2P1001 at [redacted]w[redacted]d 1. Supervision of other normal pregnancy, antepartum Cephalic by [place]  2. History of gestational hypertension BP normal today She is checking daily at home  3. [redacted] weeks gestation of pregnancy  Preterm labor symptoms and general obstetric precautions including but not limited to vaginal bleeding, contractions, leaking of fluid and fetal movement were reviewed in detail with the patient. Please refer to After Visit Summary for other counseling recommendations.   Return in about 1 week (around 04/28/2021) for low OB, in person.  No future appointments.  Conan Bowens, MD

## 2021-04-22 ENCOUNTER — Emergency Department (HOSPITAL_COMMUNITY)
Admission: EM | Admit: 2021-04-22 | Discharge: 2021-04-22 | Disposition: A | Discharge status: Home / Self Care | Payer: Medicaid Other | Attending: Emergency Medicine | Admitting: Emergency Medicine

## 2021-04-22 ENCOUNTER — Other Ambulatory Visit: Payer: Self-pay

## 2021-04-22 ENCOUNTER — Emergency Department (HOSPITAL_COMMUNITY): Payer: Medicaid Other

## 2021-04-22 ENCOUNTER — Encounter (HOSPITAL_COMMUNITY): Payer: Self-pay

## 2021-04-22 DIAGNOSIS — O9A213 Injury, poisoning and certain other consequences of external causes complicating pregnancy, third trimester: Secondary | ICD-10-CM | POA: Diagnosis present

## 2021-04-22 DIAGNOSIS — Z7982 Long term (current) use of aspirin: Secondary | ICD-10-CM | POA: Insufficient documentation

## 2021-04-22 DIAGNOSIS — I10 Essential (primary) hypertension: Secondary | ICD-10-CM | POA: Insufficient documentation

## 2021-04-22 DIAGNOSIS — S83104A Unspecified dislocation of right knee, initial encounter: Secondary | ICD-10-CM | POA: Insufficient documentation

## 2021-04-22 DIAGNOSIS — W19XXXA Unspecified fall, initial encounter: Secondary | ICD-10-CM | POA: Diagnosis not present

## 2021-04-22 DIAGNOSIS — Z3A37 37 weeks gestation of pregnancy: Secondary | ICD-10-CM | POA: Diagnosis not present

## 2021-04-22 DIAGNOSIS — Y92000 Kitchen of unspecified non-institutional (private) residence as  the place of occurrence of the external cause: Secondary | ICD-10-CM | POA: Diagnosis not present

## 2021-04-22 DIAGNOSIS — S83004A Unspecified dislocation of right patella, initial encounter: Secondary | ICD-10-CM | POA: Diagnosis not present

## 2021-04-22 DIAGNOSIS — R0689 Other abnormalities of breathing: Secondary | ICD-10-CM | POA: Diagnosis not present

## 2021-04-22 DIAGNOSIS — O99893 Other specified diseases and conditions complicating puerperium: Secondary | ICD-10-CM | POA: Diagnosis not present

## 2021-04-22 DIAGNOSIS — M25561 Pain in right knee: Secondary | ICD-10-CM | POA: Diagnosis not present

## 2021-04-22 MED ORDER — MORPHINE SULFATE (PF) 4 MG/ML IV SOLN: 4.0000 mg | Freq: Once | INTRAVENOUS | Status: AC

## 2021-04-22 MED ORDER — MORPHINE SULFATE (PF) 4 MG/ML IV SOLN
INTRAVENOUS | Status: AC
  Administered 2021-04-22: 4 mg via INTRAVENOUS
  Filled 2021-04-22: qty 1

## 2021-04-22 NOTE — ED Triage Notes (Signed)
Pt presents to ED with complaints of right knee dislocation. Pt fell backwards in kitchen and dislocated right knee. FHR dopplered 150's

## 2021-04-22 NOTE — ED Notes (Signed)
OB Rapid Response nurse informed of pt arrival and complaint. Pt denies any gush of fluid, vaginal bleeding or decreased fetal movement at this time.

## 2021-04-22 NOTE — ED Provider Notes (Signed)
[facility] EMERGENCY DEPARTMENT Provider Note   CSN: [id number] Arrival date & time: 04/22/21  1101     History Chief Complaint  Patient presents with  . Fall    Stefanie King is a 20 y.o. female.  Pt reports she took a step backwards and her knee cap dislocated.  Pt reports this has happened in the past but not this bad.  Pt is [redacted] weeks pregnant.  Pt reports she did fall down after dislocation.  Impact of leg only.  No other areas of injury  No impact of abdomen.  Pt denies any abdominal pain. No impact of head.  Pt reports she has been able to massage knee cap in in the past but today was worse.   The history is provided by the patient. No language interpreter was used.  Knee Pain Location:  Knee Injury: yes   Knee location:  R knee Pain details:    Quality:  Sharp and shooting   Radiates to:  Does not radiate   Severity:  Severe   Onset quality:  Sudden   Timing:  Constant   Progression:  Worsening Chronicity:  New Relieved by:  Nothing Worsened by:  Nothing Ineffective treatments:  None tried Associated symptoms: decreased ROM        Past Medical History:  Diagnosis Date  . Anxiety   . Disordered eating 12/25/2015  . Hypertension   . Migraines   . Pregnancy induced hypertension     Patient Active Problem List   Diagnosis Date Noted  . Vaginal yeast infection 04/15/2021  . History of gestational hypertension 10/28/2020  . Supervision of other normal pregnancy, antepartum 10/21/2020  . Chlamydia infection affecting pregnancy 10/07/2020  . Severe recurrent major depression without psychotic features ([place]) 03/11/2018  . Marijuana abuse 10/21/2017  . Adjustment disorder with mixed anxiety and depressed mood 01/13/2016  . Migraine without aura and without status migrainosus, not intractable 01/01/2016  . Disordered eating 12/25/2015  . GERD (gastroesophageal reflux disease) 09/17/2015  . Acne vulgaris 08/06/2015    Past Surgical History:  Procedure  Laterality Date  . CHOLECYSTECTOMY N/A 10/17/2014   Procedure: LAPAROSCOPIC CHOLECYSTECTOMY WITHOUT INTRAOPERATIVE CHOLANGIOGRAM;  Surgeon: Jerilynn Mages. Gerald Stabs, MD;  Location: [facility];  Service: Pediatrics;  Laterality: N/A;  laparoscopic cholecystectomy  . CHOLECYSTECTOMY  2015  . DENTAL SURGERY       OB History    Gravida  2   Para  1   Term  1   Preterm      AB      Living  1     SAB      IAB      Ectopic      Multiple  0   Live Births  1           Family History  Problem Relation Age of Onset  . Alcohol abuse Father   . Hypertension Father   . Cancer Paternal Grandmother   . Asthma Neg Hx   . Depression Neg Hx   . Diabetes Neg Hx   . Drug abuse Neg Hx   . Early death Neg Hx   . Hearing loss Neg Hx   . Heart disease Neg Hx   . Hyperlipidemia Neg Hx   . Kidney disease Neg Hx   . Mental illness Neg Hx   . Mental retardation Neg Hx   . Stroke Neg Hx     Social History   Tobacco Use  . Smoking status: Never  Smoker  . Smokeless tobacco: Never Used  . Tobacco comment: last marijuana smoked 06-25-18  Vaping Use  . Vaping Use: Never used  Substance Use Topics  . Alcohol use: No    Alcohol/week: 0.0 standard drinks  . Drug use: Not Currently    Types: Marijuana    Comment: 01-2021    Home Medications Prior to Admission medications   Medication Sig Start Date End Date Taking? Authorizing Provider  acetaminophen (TYLENOL) 325 MG tablet Take 2 tablets (650 mg total) by mouth every 4 (four) hours as needed (for pain scale < 4). Patient not taking: Reported on 04/15/2021 12/23/18   Durwin Glaze, DO  aspirin EC 81 MG tablet Take 1 tablet (81 mg total) by mouth daily. Swallow whole. 03/25/21   Leftwich-Kirby, Kathie Dike, CNM  Blood Pressure Monitoring (BLOOD PRESSURE KIT) DEVI 1 kit by Does not apply route once a week. Check Blood Pressure regularly and record readings into the [employer] App.  Large Cuff.  DX O90.0 10/21/20   Leftwich-Kirby, Kathie Dike, CNM   metoCLOPramide (REGLAN) 5 MG tablet Take 1 tablet (5 mg total) by mouth every 8 (eight) hours as needed for up to 30 doses for nausea. Patient not taking: Reported on 04/15/2021 03/25/21 04/15/21  Fatima Blank A, CNM  ondansetron (ZOFRAN-ODT) 4 MG disintegrating tablet Take by mouth. Patient not taking: Reported on 04/15/2021 09/28/20   [provider]  pantoprazole (PROTONIX) 20 MG tablet Take 1 tablet (20 mg total) by mouth daily. Patient not taking: Reported on 04/15/2021 03/25/21 04/24/21  Elvera Maria, CNM  Prenatal Vit-Fe Fumarate-FA (PRENATAL VITAMIN) 27-0.8 MG TABS Take 1 tablet by mouth daily. 05/04/18   Trude Mcburney, FNP  terconazole (TERAZOL 7) 0.4 % vaginal cream Place 1 applicator vaginally at bedtime. 04/15/21   Rasch, Artist Pais, NP    Allergies    Patient has no known allergies.  Review of Systems   Review of Systems  Musculoskeletal: Positive for joint swelling.  All other systems reviewed and are negative.   Physical Exam Updated Vital Signs BP 112/74   Pulse 61   Resp 16   Ht 5' 3"  (1.6 m)   Wt 66.2 kg   LMP 08/06/2020 (Exact Date)   SpO2 98%   BMI 25.86 kg/m   Physical Exam Vitals reviewed.  Constitutional:      Appearance: Normal appearance.  HENT:     Head: Normocephalic.  Cardiovascular:     Rate and Rhythm: Normal rate.     Pulses: Normal pulses.  Pulmonary:     Effort: Pulmonary effort is normal.  Musculoskeletal:        General: Deformity present.     Comments: Dislocated patella  nv and ns intact  Skin:    General: Skin is warm.  Neurological:     General: No focal deficit present.     Mental Status: She is alert.  Psychiatric:        Mood and Affect: Mood normal.     ED Results / Procedures / Treatments   Labs (all labs ordered are listed, but only abnormal results are displayed) Labs Reviewed - No data to display  EKG None  Radiology DG Knee Complete 4 Views Right  Result Date: 04/22/2021 CLINICAL  DATA:  Pain following fall EXAM: RIGHT KNEE - COMPLETE 4+ VIEW COMPARISON:  None. FINDINGS: Frontal, lateral, and bilateral oblique views were obtained. There is no fracture or dislocation. No joint effusion. Joint spaces appear intact. No erosive  change. IMPRESSION: No fracture, dislocation, or joint effusion. No evident arthropathy. Electronically Signed   By: Lowella Grip III M.D.   On: 04/22/2021 12:19    Procedures .Ortho Injury Treatment  Date/Time: 04/22/2021 6:07 PM Performed by: Fransico Meadow, PA-C Authorized by: Fransico Meadow, PA-C   Consent:    Consent obtained:  Verbal   Consent given by:  Patient   Risks discussed:  Fracture   Alternatives discussed:  No treatmentInjury location: knee Location details: right knee Injury type: dislocation Dislocation type: lateral patellar Pre-procedure neurovascular assessment: neurovascularly intact Pre-procedure distal perfusion: normal Pre-procedure range of motion: reduced  Anesthesia: Local anesthesia used: no  Patient sedated: NoManipulation performed: yes Reduction method: traction and counter traction Reduction successful: yes Immobilization: crutches and brace Splint Applied by: ED Nurse Post-procedure neurovascular assessment: post-procedure neurovascularly intact Post-procedure distal perfusion: normal Post-procedure neurological function: normal      Medications Ordered in ED Medications  morphine 4 MG/ML injection 4 mg (4 mg Intravenous Given 04/22/21 1115)    ED Course  I have reviewed the triage vital signs and the nursing notes.  Pertinent labs & imaging results that were available during my care of the patient were reviewed by me and considered in my medical decision making (see chart for details).    MDM Rules/Calculators/A&P                          MDM:  Pt feels much better after reduction.  No pregnancy related issues.  Pt counseled on need to wear knee imbolizer.  Pt advised to follow up with  Dr. Amedeo Kinsman Orthopaedist for evaluation.   Final Clinical Impression(s) / ED Diagnoses Final diagnoses:  Dislocation, knee closed, right, initial encounter    Rx / DC Orders ED Discharge Orders    None    An After Visit Summary was printed and given to the patient.    Fransico Meadow, [place] 04/22/21 1811    Lajean Saver, MD 04/25/21 1534

## 2021-04-22 NOTE — Progress Notes (Addendum)
[facility] notified that pt presented to ED after taking a step backwards and falling.  Pt has a dislocated/possible fractured knee.  Pt is G2P1 at 37.0 wk with history of vaginal delivery.  Pt did not fall on abdomen.  ED has Dopplered fht at 150bpm but is unable to trace her continuously due to another pregnant pt being there currently.  Dr Adrian Blackwater says he is alright with doppler since pt has no obstetrical complaints.  [facility] informed of this and notified to keep [facility] nurse updated on pt status.

## 2021-04-22 NOTE — ED Notes (Signed)
OB rapid nurse aware pt will be d/c home. They agree with the plan of care. PA aware.

## 2021-04-22 NOTE — ED Notes (Signed)
MSE not signed, Langston Masker PA at bedside prior to triage complete.

## 2021-04-22 NOTE — Progress Notes (Signed)
ED called to say pt's knee had been relocated and that she was about to be discharged home.  Pt still has no OB complaints.  Dr Adrian Blackwater gives ok for DC.  Does not require further fhr monitoring.

## 2021-04-22 NOTE — Discharge Instructions (Signed)
Return if any problems.

## 2021-04-23 ENCOUNTER — Telehealth: Payer: Self-pay

## 2021-04-23 NOTE — Telephone Encounter (Signed)
Transition Care Management Unsuccessful Follow-up Telephone Call  Date of discharge and from where:  04/22/2021 from [facility] Attempts:  1st Attempt  Reason for unsuccessful TCM follow-up call:  Unable to leave message

## 2021-04-24 NOTE — Telephone Encounter (Signed)
Transition Care Management Unsuccessful Follow-up Telephone Call  Date of discharge and from where:  04/22/2021 from [facility]  Attempts:  2nd Attempt  Reason for unsuccessful TCM follow-up call:  Unable to leave message

## 2021-04-25 ENCOUNTER — Ambulatory Visit (INDEPENDENT_AMBULATORY_CARE_PROVIDER_SITE_OTHER): Payer: Medicaid Other | Admitting: Orthopedic Surgery

## 2021-04-25 ENCOUNTER — Encounter: Payer: Self-pay | Admitting: Orthopedic Surgery

## 2021-04-25 ENCOUNTER — Other Ambulatory Visit: Payer: Self-pay

## 2021-04-25 VITALS — BP 118/70 | HR 112 | Ht 63.0 in | Wt 146.0 lb

## 2021-04-25 DIAGNOSIS — Z331 Pregnant state, incidental: Secondary | ICD-10-CM | POA: Diagnosis not present

## 2021-04-25 DIAGNOSIS — S83004A Unspecified dislocation of right patella, initial encounter: Secondary | ICD-10-CM | POA: Diagnosis not present

## 2021-04-25 NOTE — Progress Notes (Signed)
New Patient Visit  Assessment: Stefanie King is a 20 y.o. 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  This was her first dislocation in more than 10 years.  However, this is also the first time that she required a reduction maneuver.  We discussed the typical course following a patellar dislocation, but this is currently complicated by the fact that she is [redacted] weeks pregnant.  As a result, we did not discuss the possibility or procedure for surgery in any great detail.  Her recovery will be tricky, as she will be giving birth in the coming weeks.  Nonetheless, I have advised her to continue to wear the knee immobilizer at all times, and remain nonweightbearing for the next 2 weeks.  After that, she can start to come out of the knee immobilizer, and start to bear some weight on her right knee.  We will plan to see her back in approximately 4 weeks, at which time we will likely initiate some physical therapy.  If she ends up delivering her baby in the next few weeks, while she is supposed to maintain a straight leg in a knee immobilizer, they will have to proceed appropriately to ensure the health of the mother and the baby.  No questions were answered and she is amenable to this plan.   Follow-up: Return in about 4 weeks (around 05/23/2021).  Subjective:  Chief Complaint  Patient presents with   Knee Pain    Knee popped out of place on DOI 04/22/21    History of Present Illness: Stefanie King is a 20 y.o. female who presents for evaluation of her right knee.  Just a few days ago, she sustained a right patella dislocation, which did require a formal reduction in the emergency department.  She states that she stepped backwards, and the patella dislocated.  It caused severe pain.  She continues to have apprehension with even limited motion.  She does have a history of right patella dislocation but this was several years  ago.  None of these events required a formal reduction.  She has previously participated in physical therapy.  She is currently [redacted] weeks pregnant.   Review of Systems: No fevers or chills No numbness or tingling No chest pain No shortness of breath No bowel or bladder dysfunction No GI distress No headaches   Medical History:  Past Medical History:  Diagnosis Date   Anxiety    Disordered eating 12/25/2015   Hypertension    Migraines    Pregnancy induced hypertension     Past Surgical History:  Procedure Laterality Date   CHOLECYSTECTOMY N/A 10/17/2014   Procedure: LAPAROSCOPIC CHOLECYSTECTOMY WITHOUT INTRAOPERATIVE CHOLANGIOGRAM;  Surgeon: Judie Petit. Leonia Corona, MD;  Location: [facility] OR;  Service: Pediatrics;  Laterality: N/A;  laparoscopic cholecystectomy   CHOLECYSTECTOMY  2015   DENTAL SURGERY      Family History  Problem Relation Age of Onset   Alcohol abuse Father    Hypertension Father    Cancer Paternal Grandmother    Asthma Neg Hx    Depression Neg Hx    Diabetes Neg Hx    Drug abuse Neg Hx    Early death Neg Hx    Hearing loss Neg Hx    Heart disease Neg Hx    Hyperlipidemia Neg Hx    Kidney disease Neg Hx    Mental illness Neg Hx    Mental retardation Neg Hx  Stroke Neg Hx    Social History   Tobacco Use   Smoking status: Never   Smokeless tobacco: Never   Tobacco comments:    last marijuana smoked 06-25-18  Vaping Use   Vaping Use: Never used  Substance Use Topics   Alcohol use: No    Alcohol/week: 0.0 standard drinks   Drug use: Not Currently    Types: Marijuana    Comment: 01-2021    No Known Allergies  Current Meds  Medication Sig   aspirin EC 81 MG tablet Take 1 tablet (81 mg total) by mouth daily. Swallow whole.   Prenatal Vit-Fe Fumarate-FA (PRENATAL VITAMIN) 27-0.8 MG TABS Take 1 tablet by mouth daily.    Objective: BP 118/70   Pulse (!) 112   Ht 5\' 3"  (1.6 m)   Wt 146 lb (66.2 kg)   LMP 08/06/2020 (Exact Date)   BMI 25.86 kg/m    Physical Exam:  General: Alert and oriented.  No acute distress. Gait: She has been nonweightbearing, using crutches.  Evaluation of the right knee demonstrates an obvious effusion.  There is no obvious bruising.  She has tenderness to palpation along the medial aspect of the patella.  Apprehension with patellar translation.  Toes are warm and well-perfused.  Intact sensation.    IMAGING: I personally reviewed images previously obtained from the ED  X-rays obtained in the emergency department demonstrates a dislocated patella, with postreduction x-rays demonstrating appropriate alignment.   New Medications:  No orders of the defined types were placed in this encounter.     08/08/2020, MD  04/25/2021 11:19 AM

## 2021-04-25 NOTE — Telephone Encounter (Signed)
Transition Care Management Unsuccessful Follow-up Telephone Call  Date of discharge and from where:  04/22/2021 from [facility]  Attempts:  3rd Attempt  Reason for unsuccessful TCM follow-up call:  Unable to reach patient

## 2021-04-26 ENCOUNTER — Encounter (HOSPITAL_COMMUNITY): Payer: Self-pay | Admitting: Obstetrics and Gynecology

## 2021-04-26 ENCOUNTER — Other Ambulatory Visit: Payer: Self-pay

## 2021-04-26 ENCOUNTER — Inpatient Hospital Stay (HOSPITAL_COMMUNITY)
Admission: AD | Admit: 2021-04-26 | Discharge: 2021-04-26 | Disposition: A | Discharge status: Home / Self Care | Payer: Medicaid Other | Source: Ambulatory Visit | Attending: Obstetrics and Gynecology | Admitting: Obstetrics and Gynecology

## 2021-04-26 DIAGNOSIS — B373 Candidiasis of vulva and vagina: Secondary | ICD-10-CM | POA: Insufficient documentation

## 2021-04-26 DIAGNOSIS — O133 Gestational [pregnancy-induced] hypertension without significant proteinuria, third trimester: Secondary | ICD-10-CM | POA: Diagnosis not present

## 2021-04-26 DIAGNOSIS — Z79899 Other long term (current) drug therapy: Secondary | ICD-10-CM | POA: Diagnosis not present

## 2021-04-26 DIAGNOSIS — Z3A37 37 weeks gestation of pregnancy: Secondary | ICD-10-CM | POA: Insufficient documentation

## 2021-04-26 DIAGNOSIS — Z8249 Family history of ischemic heart disease and other diseases of the circulatory system: Secondary | ICD-10-CM | POA: Diagnosis not present

## 2021-04-26 DIAGNOSIS — O98813 Other maternal infectious and parasitic diseases complicating pregnancy, third trimester: Secondary | ICD-10-CM | POA: Diagnosis not present

## 2021-04-26 DIAGNOSIS — Z7982 Long term (current) use of aspirin: Secondary | ICD-10-CM | POA: Diagnosis not present

## 2021-04-26 DIAGNOSIS — B3731 Acute candidiasis of vulva and vagina: Secondary | ICD-10-CM

## 2021-04-26 LAB — URINALYSIS, ROUTINE W REFLEX MICROSCOPIC
Bilirubin Urine: NEGATIVE
Glucose, UA: NEGATIVE mg/dL
Ketones, ur: NEGATIVE mg/dL
Nitrite: NEGATIVE
Protein, ur: NEGATIVE mg/dL
Specific Gravity, Urine: 1.005 (ref 1.005–1.030)
WBC, UA: >50 WBC/hpf — ABNORMAL HIGH (ref 0–5)
pH: 7 (ref 5.0–8.0)

## 2021-04-26 LAB — CBC
HCT: 34.2 % — ABNORMAL LOW (ref 36.0–46.0)
Hemoglobin: 11.3 g/dL — ABNORMAL LOW (ref 12.0–15.0)
MCH: 29.6 pg (ref 26.0–34.0)
MCHC: 33 g/dL (ref 30.0–36.0)
MCV: 89.5 fL (ref 80.0–100.0)
Platelets: 298 10*3/uL (ref 150–400)
RBC: 3.82 MIL/uL — ABNORMAL LOW (ref 3.87–5.11)
RDW: 12.1 % (ref 11.5–15.5)
WBC: 12.5 10*3/uL — ABNORMAL HIGH (ref 4.0–10.5)
nRBC: 0 % (ref 0.0–0.2)

## 2021-04-26 LAB — COMPREHENSIVE METABOLIC PANEL
ALT: 14 U/L (ref 0–44)
AST: 16 U/L (ref 15–41)
Albumin: 2.8 g/dL — ABNORMAL LOW (ref 3.5–5.0)
Alkaline Phosphatase: 127 U/L — ABNORMAL HIGH (ref 38–126)
Anion gap: 7 (ref 5–15)
BUN: 6 mg/dL (ref 6–20)
CO2: 24 mmol/L (ref 22–32)
Calcium: 8.6 mg/dL — ABNORMAL LOW (ref 8.9–10.3)
Chloride: 105 mmol/L (ref 98–111)
Creatinine, Ser: 0.61 mg/dL (ref 0.44–1.00)
GFR, Estimated: >60 mL/min (ref >60)
Glucose, Bld: 76 mg/dL (ref 70–99)
Potassium: 3.3 mmol/L — ABNORMAL LOW (ref 3.5–5.1)
Sodium: 136 mmol/L (ref 135–145)
Total Bilirubin: 0.4 mg/dL (ref 0.3–1.2)
Total Protein: 5.7 g/dL — ABNORMAL LOW (ref 6.5–8.1)

## 2021-04-26 LAB — PROTEIN / CREATININE RATIO, URINE
Creatinine, Urine: 37.82 mg/dL
Protein Creatinine Ratio: 0.58 mg/mg{Cre} — ABNORMAL HIGH (ref 0.00–0.15)
Total Protein, Urine: 22 mg/dL

## 2021-04-26 MED ORDER — FLUCONAZOLE 150 MG PO TABS: 150.0000 mg | ORAL_TABLET | Freq: Every day | ORAL | 0 refills | Status: DC

## 2021-04-26 NOTE — MAU Provider Note (Signed)
History     CSN: [id number]  Arrival date and time: 04/26/21 1740   Event Date/Time   First Provider Initiated Contact with Patient 04/26/21 1817      Chief Complaint  Patient presents with   Hypertension   Headache   HPI Stefanie King is a 20 y.o. G2P1001 at 67w4dwho presents stating her BP was elevated at home. She states she takes her BP daily because of her history of gestational hypertension.She reports today she didn't feel good so she was taking it more frequently than normal. She states she was seeing BP readings in the 140s/100s. She reports a headache and spots in her vision earlier today but denies any symptoms at this time. She reports intermittent, not painful contractions. She denies any bleeding or leaking. Reports normal fetal movement.  OB History     Gravida  2   Para  1   Term  1   Preterm      AB      Living  1      SAB      IAB      Ectopic      Multiple  0   Live Births  1           Past Medical History:  Diagnosis Date   Anxiety    Disordered eating 12/25/2015   Hypertension    Migraines    Pregnancy induced hypertension     Past Surgical History:  Procedure Laterality Date   CHOLECYSTECTOMY N/A 10/17/2014   Procedure: LAPAROSCOPIC CHOLECYSTECTOMY WITHOUT INTRAOPERATIVE CHOLANGIOGRAM;  Surgeon: MJerilynn Mages SGerald Stabs MD;  [place]  Service: Pediatrics;  Laterality: N/A;  laparoscopic cholecystectomy   CHOLECYSTECTOMY  2015   DENTAL SURGERY      Family History  Problem Relation Age of Onset   Alcohol abuse Father    Hypertension Father    Cancer Paternal Grandmother    Asthma Neg Hx    Depression Neg Hx    Diabetes Neg Hx    Drug abuse Neg Hx    Early death Neg Hx    Hearing loss Neg Hx    Heart disease Neg Hx    Hyperlipidemia Neg Hx    Kidney disease Neg Hx    Mental illness Neg Hx    Mental retardation Neg Hx    Stroke Neg Hx     Social History   Tobacco Use   Smoking status: Never   Smokeless  tobacco: Never   Tobacco comments:    last marijuana smoked 06-25-18  Vaping Use   Vaping Use: Never used  Substance Use Topics   Alcohol use: No    Alcohol/week: 0.0 standard drinks   Drug use: Not Currently    Types: Marijuana    Comment: 01-2021    Allergies: No Known Allergies  Medications Prior to Admission  Medication Sig Dispense Refill Last Dose   acetaminophen (TYLENOL) 325 MG tablet Take 2 tablets (650 mg total) by mouth every 4 (four) hours as needed (for pain scale < 4). (Patient not taking: No sig reported) 30 tablet 0    aspirin EC 81 MG tablet Take 1 tablet (81 mg total) by mouth daily. Swallow whole. 30 tablet 2    Blood Pressure Monitoring (BLOOD PRESSURE KIT) DEVI 1 kit by Does not apply route once a week. Check Blood Pressure regularly and record readings into the [employer] App.  Large Cuff.  DX O90.0 (Patient not taking: Reported on 04/25/2021) 1 each  0    metoCLOPramide (REGLAN) 5 MG tablet Take 1 tablet (5 mg total) by mouth every 8 (eight) hours as needed for up to 30 doses for nausea. (Patient not taking: Reported on 04/15/2021) 30 tablet 3    ondansetron (ZOFRAN-ODT) 4 MG disintegrating tablet Take by mouth. (Patient not taking: No sig reported)      pantoprazole (PROTONIX) 20 MG tablet Take 1 tablet (20 mg total) by mouth daily. (Patient not taking: Reported on 04/15/2021) 30 tablet 3    Prenatal Vit-Fe Fumarate-FA (PRENATAL VITAMIN) 27-0.8 MG TABS Take 1 tablet by mouth daily. 90 tablet 3    terconazole (TERAZOL 7) 0.4 % vaginal cream Place 1 applicator vaginally at bedtime. (Patient not taking: Reported on 04/25/2021) 45 g 0     Review of Systems  Constitutional: Negative.  Negative for fatigue and fever.  HENT: Negative.    Respiratory: Negative.  Negative for shortness of breath.   Cardiovascular: Negative.  Negative for chest pain.  Gastrointestinal: Negative.  Negative for abdominal pain, constipation, diarrhea, nausea and vomiting.  Genitourinary:  Negative.  Negative for dysuria and vaginal bleeding.  Neurological:  Positive for headaches. Negative for dizziness.  Physical Exam   Blood pressure 121/80, pulse 84, temperature 98 F (36.7 C), temperature source Oral, resp. rate 16, last menstrual period 08/06/2020, SpO2 98 %, currently breastfeeding. Patient Vitals for the past 24 hrs:  BP Temp Temp src Pulse Resp SpO2  04/26/21 1931 106/69 -- -- 75 -- --  04/26/21 1916 124/69 -- -- 71 -- --  04/26/21 1900 120/71 -- -- 74 -- 99 %  04/26/21 1845 117/83 -- -- 75 -- --  04/26/21 1830 128/80 -- -- 83 -- 99 %  04/26/21 1807 121/80 -- -- 84 -- --  04/26/21 1753 118/81 98 F (36.7 C) Oral (!) 114 16 98 %    Physical Exam Vitals and nursing note reviewed.  Constitutional:      General: She is not in acute distress.    Appearance: She is well-developed.  HENT:     Head: Normocephalic.  Eyes:     Pupils: Pupils are equal, round, and reactive to light.  Cardiovascular:     Rate and Rhythm: Normal rate and regular rhythm.     Heart sounds: Normal heart sounds.  Pulmonary:     Effort: Pulmonary effort is normal. No respiratory distress.     Breath sounds: Normal breath sounds.  Abdominal:     General: Bowel sounds are normal. There is no distension.     Palpations: Abdomen is soft.     Tenderness: There is no abdominal tenderness.  Skin:    General: Skin is warm and dry.  Neurological:     Mental Status: She is alert and oriented to person, place, and time.  Psychiatric:        Mood and Affect: Mood normal.        Behavior: Behavior normal.        Thought Content: Thought content normal.        Judgment: Judgment normal.    MAU Course  Procedures Results for orders placed or performed during the hospital encounter of 04/26/21 (from the past 24 hour(s))  Urinalysis, Routine w reflex microscopic Urine, Clean Catch     Status: Abnormal   Collection Time: 04/26/21  5:45 PM  Result Value Ref Range   Color, Urine YELLOW YELLOW    APPearance CLOUDY (A) CLEAR   Specific Gravity, Urine 1.005 1.005 - 1.030  pH 7.0 5.0 - 8.0   Glucose, UA NEGATIVE NEGATIVE mg/dL   Hgb urine dipstick SMALL (A) NEGATIVE   Bilirubin Urine NEGATIVE NEGATIVE   Ketones, ur NEGATIVE NEGATIVE mg/dL   Protein, ur NEGATIVE NEGATIVE mg/dL   Nitrite NEGATIVE NEGATIVE   Leukocytes,Ua LARGE (A) NEGATIVE   RBC / HPF 11-20 0 - 5 RBC/hpf   WBC, UA >50 (H) 0 - 5 WBC/hpf   Bacteria, UA MANY (A) NONE SEEN   Squamous Epithelial / LPF 21-50 0 - 5   WBC Clumps PRESENT    Budding Yeast PRESENT    Non Squamous Epithelial 0-5 (A) NONE SEEN  Protein / creatinine ratio, urine     Status: Abnormal   Collection Time: 04/26/21  6:01 PM  Result Value Ref Range   Creatinine, Urine 37.82 mg/dL   Total Protein, Urine 22 mg/dL   Protein Creatinine Ratio 0.58 (H) 0.00 - 0.15 mg/mg[Cre]  CBC     Status: Abnormal   Collection Time: 04/26/21  6:31 PM  Result Value Ref Range   WBC 12.5 (H) 4.0 - 10.5 K/uL   RBC 3.82 (L) 3.87 - 5.11 MIL/uL   Hemoglobin 11.3 (L) 12.0 - 15.0 g/dL   HCT 34.2 (L) 36.0 - 46.0 %   MCV 89.5 80.0 - 100.0 fL   MCH 29.6 26.0 - 34.0 pg   MCHC 33.0 30.0 - 36.0 g/dL   RDW 12.1 11.5 - 15.5 %   Platelets 298 150 - 400 K/uL   nRBC 0.0 0.0 - 0.2 %  Comprehensive metabolic panel     Status: Abnormal   Collection Time: 04/26/21  6:31 PM  Result Value Ref Range   Sodium 136 135 - 145 mmol/L   Potassium 3.3 (L) 3.5 - 5.1 mmol/L   Chloride 105 98 - 111 mmol/L   CO2 24 22 - 32 mmol/L   Glucose, Bld 76 70 - 99 mg/dL   BUN 6 6 - 20 mg/dL   Creatinine, Ser 0.61 0.44 - 1.00 mg/dL   Calcium 8.6 (L) 8.9 - 10.3 mg/dL   Total Protein 5.7 (L) 6.5 - 8.1 g/dL   Albumin 2.8 (L) 3.5 - 5.0 g/dL   AST 16 15 - 41 U/L   ALT 14 0 - 44 U/L   Alkaline Phosphatase 127 (H) 38 - 126 U/L   Total Bilirubin 0.4 0.3 - 1.2 mg/dL   GFR, Estimated >60 >60 mL/min   Anion gap 7 5 - 15    MDM UA CBC, CMP, Protein/creat ratio  Consulted with Dr. Elgie Congo- PCR likely  elevated due to contamination from yeast or blood. Will review strict preeclampsia precautions and when to return to MAU  Assessment and Plan   1. Candidiasis of vagina during pregnancy   2. [redacted] weeks gestation of pregnancy    -Discharge home in stable condition -Rx for diflucan per patient request -Preeclampsia precautions discussed -Patient advised to follow-up with OB as scheduled on Monday for prenatal care -Patient may return to MAU as needed or if her condition were to change or worsen   Wende Mott CNM 04/26/2021, 6:18 PM

## 2021-04-26 NOTE — MAU Note (Signed)
Stefanie King is a 20 y.o. at [redacted]w[redacted]d here in MAU reporting: pt reports she usually checks BP daily due to hx of GHTN, today was seeing dots and felt lightheaded so she was checking more often. Reports at home her BP's 142/116, 141/100, and 151/114. Reports spots in her vision are now intermittent. Has a headache. No RUQ pain or increased swelling. Having irregular contractions but no bleeding or LOF. +FM  Onset of complaint: today  Pain score: 4/10  Vitals:   04/26/21 1753  BP: 118/81  Pulse: (!) 114  Resp: 16  Temp: 98 F (36.7 C)  SpO2: 98%     FHT:164  Lab orders placed from triage: UA

## 2021-04-26 NOTE — Discharge Instructions (Signed)

## 2021-04-28 ENCOUNTER — Ambulatory Visit (INDEPENDENT_AMBULATORY_CARE_PROVIDER_SITE_OTHER): Payer: Medicaid Other | Admitting: Advanced Practice Midwife

## 2021-04-28 ENCOUNTER — Encounter: Payer: Self-pay | Admitting: Advanced Practice Midwife

## 2021-04-28 ENCOUNTER — Other Ambulatory Visit: Payer: Self-pay

## 2021-04-28 VITALS — BP 115/75 | HR 118 | Wt 148.0 lb

## 2021-04-28 DIAGNOSIS — Z3483 Encounter for supervision of other normal pregnancy, third trimester: Secondary | ICD-10-CM

## 2021-04-28 DIAGNOSIS — Z3A37 37 weeks gestation of pregnancy: Secondary | ICD-10-CM

## 2021-04-28 DIAGNOSIS — S83104S Unspecified dislocation of right knee, sequela: Secondary | ICD-10-CM

## 2021-04-28 DIAGNOSIS — Z3480 Encounter for supervision of other normal pregnancy, unspecified trimester: Secondary | ICD-10-CM

## 2021-04-28 LAB — CULTURE, OB URINE: Culture: >=100000 — AB

## 2021-04-28 NOTE — Patient Instructions (Signed)
Things to Try After 37 weeks to Encourage Labor/Get Ready for Labor:    Try the Miles Circuit at www.milescircuit.com daily to improve baby's position and encourage the onset of labor.  Walk a little and rest a little every day.  Change positions often.  Cervical Ripening: May try one or both Red Raspberry Leaf capsules or tea:  two 300mg or 400mg tablets with each meal, 2-3 times a day, or 1-3 cups of tea daily  Potential Side Effects Of Raspberry Leaf:  Most women do not experience any side effects from drinking raspberry leaf tea. However, nausea and loose stools are possible   Evening Primrose Oil capsules: take 1 capsule by mouth and place one capsule in the vagina every night.    Some of the potential side effects:  Upset stomach  Loose stools or diarrhea  Headaches  Nausea  Sex can also help the cervix ripen and encourage labor onset.    Labor Precautions Reasons to come to MAU at [facility]:  1.  Contractions are  5 minutes apart or less, each last 1 minute, these have been going on for 1-2 hours, and you cannot walk or talk during them 2.  You have a large gush of fluid, or a trickle of fluid that will not stop and you have to wear a pad 3.  You have bleeding that is bright red, heavier than spotting--like menstrual bleeding (spotting can be normal in early labor or after a check of your cervix) 4.  You do not feel the baby moving like he/she normally does  

## 2021-04-28 NOTE — Progress Notes (Signed)
ROB [redacted]w[redacted]d Recent MAU visit on 04/26/21 due to HTN/HA  Had Urine Cx done No GBS isolated  Pt would like cervix check today.   CC: None

## 2021-04-28 NOTE — Progress Notes (Signed)
   PRENATAL VISIT NOTE  Subjective:  Stefanie King is a 20 y.o. G2P1001 at [redacted]w[redacted]d being seen today for ongoing prenatal care.  She is currently monitored for the following issues for this low-risk pregnancy and has Acne vulgaris; GERD (gastroesophageal reflux disease); Disordered eating; Migraine without aura and without status migrainosus, not intractable; Adjustment disorder with mixed anxiety and depressed mood; Marijuana abuse; Severe recurrent major depression without psychotic features (HCC); Chlamydia infection affecting pregnancy; Supervision of other normal pregnancy, antepartum; History of gestational hypertension; and Vaginal yeast infection on their problem list.  Patient reports occasional contractions.  Contractions: Irritability. Vag. Bleeding: None.  Movement: Present. Denies leaking of fluid.   The following portions of the patient's history were reviewed and updated as appropriate: allergies, current medications, past family history, past medical history, past social history, past surgical history and problem list.   Objective:   Vitals:   04/28/21 1533  BP: 115/75  Pulse: (!) 118  Weight: 148 lb (67.1 kg)    Fetal Status: Fetal Heart Rate (bpm): 162   Movement: Present  Presentation: Vertex  General:  Alert, oriented and cooperative. [name] in no acute distress.  Skin: Skin is warm and dry. No rash noted.   Cardiovascular: Normal heart rate noted  Respiratory: Normal respiratory effort, no problems with respiration noted  Abdomen: Soft, gravid, appropriate for gestational age.  Pain/Pressure: Present     Pelvic: Cervical exam performed in the presence of a chaperone Dilation: 2 Effacement (%): 70 Station: -2  Extremities: Normal range of motion.  Edema: Trace  Mental Status: Normal mood and affect. Normal behavior. Normal judgment and thought content.   Assessment and Plan:  Pregnancy: G2P1001 at [redacted]w[redacted]d 1. [redacted] weeks gestation of pregnancy   2. Supervision of  other normal pregnancy, antepartum --Anticipatory guidance about next visits/weeks of pregnancy given. --Next visit in 1 week  3. Dislocated knee, right, sequela --Knee immobilized with velcro brace.  Pt does take brace off at home and gently bends knee and stretches.  Discussed options during labor/delivery, including positioning for labor if she is wearing brace and option to remove it during labor, especially with epidural.     Term labor symptoms and general obstetric precautions including but not limited to vaginal bleeding, contractions, leaking of fluid and fetal movement were reviewed in detail with the patient. Please refer to After Visit Summary for other counseling recommendations.   Return in about 1 week (around 05/05/2021).  Future Appointments  Date Time Provider Department Center  05/05/2021  3:45 PM Adam Phenix, MD [facility] None  05/23/2021  8:30 AM Oliver Barre, MD [facility] None    Sharen Counter, CNM

## 2021-04-29 ENCOUNTER — Encounter: Payer: Self-pay | Admitting: Obstetrics

## 2021-04-29 ENCOUNTER — Inpatient Hospital Stay (HOSPITAL_COMMUNITY): Payer: Medicaid Other | Admitting: Anesthesiology

## 2021-04-29 ENCOUNTER — Encounter (HOSPITAL_COMMUNITY): Payer: Self-pay | Admitting: Family Medicine

## 2021-04-29 ENCOUNTER — Inpatient Hospital Stay (HOSPITAL_COMMUNITY)
Admission: AD | Admit: 2021-04-29 | Discharge: 2021-04-30 | DRG: 807 | Disposition: A | Discharge status: Home / Self Care | Payer: Medicaid Other | Attending: Family Medicine | Admitting: Family Medicine

## 2021-04-29 DIAGNOSIS — Z30430 Encounter for insertion of intrauterine contraceptive device: Secondary | ICD-10-CM

## 2021-04-29 DIAGNOSIS — Z20822 Contact with and (suspected) exposure to covid-19: Secondary | ICD-10-CM | POA: Diagnosis present

## 2021-04-29 DIAGNOSIS — O26893 Other specified pregnancy related conditions, third trimester: Secondary | ICD-10-CM | POA: Diagnosis not present

## 2021-04-29 DIAGNOSIS — Z3A38 38 weeks gestation of pregnancy: Secondary | ICD-10-CM

## 2021-04-29 DIAGNOSIS — Z975 Presence of (intrauterine) contraceptive device: Secondary | ICD-10-CM

## 2021-04-29 DIAGNOSIS — O134 Gestational [pregnancy-induced] hypertension without significant proteinuria, complicating childbirth: Secondary | ICD-10-CM | POA: Diagnosis not present

## 2021-04-29 DIAGNOSIS — Z30014 Encounter for initial prescription of intrauterine contraceptive device: Secondary | ICD-10-CM

## 2021-04-29 LAB — RPR: RPR Ser Ql: NONREACTIVE

## 2021-04-29 LAB — CBC
HCT: 34.9 % — ABNORMAL LOW (ref 36.0–46.0)
Hemoglobin: 11.8 g/dL — ABNORMAL LOW (ref 12.0–15.0)
MCH: 30.6 pg (ref 26.0–34.0)
MCHC: 33.8 g/dL (ref 30.0–36.0)
MCV: 90.4 fL (ref 80.0–100.0)
Platelets: 295 10*3/uL (ref 150–400)
RBC: 3.86 MIL/uL — ABNORMAL LOW (ref 3.87–5.11)
RDW: 12.3 % (ref 11.5–15.5)
WBC: 19.3 10*3/uL — ABNORMAL HIGH (ref 4.0–10.5)
nRBC: 0 % (ref 0.0–0.2)

## 2021-04-29 LAB — RESP PANEL BY RT-PCR (FLU A&B, COVID) ARPGX2
Influenza A by PCR: NEGATIVE
Influenza B by PCR: NEGATIVE
SARS Coronavirus 2 by RT PCR: NEGATIVE

## 2021-04-29 LAB — TYPE AND SCREEN
ABO/RH(D): A POS
Antibody Screen: NEGATIVE

## 2021-04-29 MED ORDER — ACETAMINOPHEN 325 MG PO TABS
650.0000 mg | ORAL_TABLET | ORAL | Status: DC | PRN
  Administered 2021-04-29 – 2021-04-30 (×2): 650 mg via ORAL
  Filled 2021-04-29 (×2): qty 2

## 2021-04-29 MED ORDER — SOD CITRATE-CITRIC ACID 500-334 MG/5ML PO SOLN: 30.0000 mL | ORAL | Status: DC | PRN

## 2021-04-29 MED ORDER — LACTATED RINGERS IV SOLN
500.0000 mL | Freq: Once | INTRAVENOUS | Status: AC
  Administered 2021-04-29: 500 mL via INTRAVENOUS

## 2021-04-29 MED ORDER — EPHEDRINE 5 MG/ML INJ: 10.0000 mg | INTRAVENOUS | Status: DC | PRN

## 2021-04-29 MED ORDER — LACTATED RINGERS IV SOLN: 500.0000 mL | INTRAVENOUS | Status: DC | PRN

## 2021-04-29 MED ORDER — PHENYLEPHRINE 40 MCG/ML (10ML) SYRINGE FOR IV PUSH (FOR BLOOD PRESSURE SUPPORT)
80.0000 ug | PREFILLED_SYRINGE | INTRAVENOUS | Status: DC | PRN
  Filled 2021-04-29: qty 10

## 2021-04-29 MED ORDER — FENTANYL-BUPIVACAINE-NACL 0.5-0.125-0.9 MG/250ML-% EP SOLN
12.0000 mL/h | EPIDURAL | Status: DC | PRN
  Administered 2021-04-29: 12 mL/h via EPIDURAL
  Filled 2021-04-29 (×2): qty 250

## 2021-04-29 MED ORDER — TETANUS-DIPHTH-ACELL PERTUSSIS 5-2.5-18.5 LF-MCG/0.5 IM SUSY: 0.5000 mL | PREFILLED_SYRINGE | Freq: Once | INTRAMUSCULAR | Status: DC

## 2021-04-29 MED ORDER — ONDANSETRON HCL 4 MG/2ML IJ SOLN
4.0000 mg | Freq: Four times a day (QID) | INTRAMUSCULAR | Status: DC | PRN
  Administered 2021-04-29: 4 mg via INTRAVENOUS
  Filled 2021-04-29: qty 2

## 2021-04-29 MED ORDER — LACTATED RINGERS IV SOLN: INTRAVENOUS | Status: DC

## 2021-04-29 MED ORDER — LEVONORGESTREL 20.1 MCG/DAY IU IUD
1.0000 | INTRAUTERINE_SYSTEM | Freq: Once | INTRAUTERINE | Status: AC
  Administered 2021-04-29: 1 via INTRAUTERINE
  Filled 2021-04-29: qty 1

## 2021-04-29 MED ORDER — ONDANSETRON HCL 4 MG/2ML IJ SOLN: 4.0000 mg | INTRAMUSCULAR | Status: DC | PRN

## 2021-04-29 MED ORDER — IBUPROFEN 600 MG PO TABS
600.0000 mg | ORAL_TABLET | Freq: Four times a day (QID) | ORAL | Status: DC
  Administered 2021-04-29 – 2021-04-30 (×4): 600 mg via ORAL
  Filled 2021-04-29 (×4): qty 1

## 2021-04-29 MED ORDER — DOCUSATE SODIUM 100 MG PO CAPS
100.0000 mg | ORAL_CAPSULE | Freq: Two times a day (BID) | ORAL | Status: DC
  Administered 2021-04-29 – 2021-04-30 (×2): 100 mg via ORAL
  Filled 2021-04-29 (×2): qty 1

## 2021-04-29 MED ORDER — DIBUCAINE (PERIANAL) 1 % EX OINT: 1.0000 "application " | TOPICAL_OINTMENT | CUTANEOUS | Status: DC | PRN

## 2021-04-29 MED ORDER — BENZOCAINE-MENTHOL 20-0.5 % EX AERO
1.0000 "application " | INHALATION_SPRAY | CUTANEOUS | Status: DC | PRN
  Filled 2021-04-29: qty 56

## 2021-04-29 MED ORDER — ONDANSETRON HCL 4 MG PO TABS: 4.0000 mg | ORAL_TABLET | ORAL | Status: DC | PRN

## 2021-04-29 MED ORDER — WITCH HAZEL-GLYCERIN EX PADS: 1.0000 "application " | MEDICATED_PAD | CUTANEOUS | Status: DC | PRN

## 2021-04-29 MED ORDER — SIMETHICONE 80 MG PO CHEW: 80.0000 mg | CHEWABLE_TABLET | ORAL | Status: DC | PRN

## 2021-04-29 MED ORDER — PRENATAL MULTIVITAMIN CH
1.0000 | ORAL_TABLET | Freq: Every day | ORAL | Status: DC
  Administered 2021-04-30: 1 via ORAL
  Filled 2021-04-29: qty 1

## 2021-04-29 MED ORDER — OXYTOCIN BOLUS FROM INFUSION
333.0000 mL | Freq: Once | INTRAVENOUS | Status: AC
  Administered 2021-04-29: 333 mL via INTRAVENOUS

## 2021-04-29 MED ORDER — COCONUT OIL OIL: 1.0000 "application " | TOPICAL_OIL | Status: DC | PRN

## 2021-04-29 MED ORDER — DIPHENHYDRAMINE HCL 25 MG PO CAPS
25.0000 mg | ORAL_CAPSULE | Freq: Four times a day (QID) | ORAL | Status: DC | PRN
Start: 2021-04-29 — End: 2021-04-30

## 2021-04-29 MED ORDER — FENTANYL CITRATE (PF) 100 MCG/2ML IJ SOLN
50.0000 ug | INTRAMUSCULAR | Status: DC | PRN
  Administered 2021-04-29: 100 ug via INTRAVENOUS
  Filled 2021-04-29: qty 2

## 2021-04-29 MED ORDER — OXYTOCIN-SODIUM CHLORIDE 30-0.9 UT/500ML-% IV SOLN
2.5000 [IU]/h | INTRAVENOUS | Status: DC
  Filled 2021-04-29: qty 500

## 2021-04-29 MED ORDER — SENNOSIDES-DOCUSATE SODIUM 8.6-50 MG PO TABS
2.0000 | ORAL_TABLET | ORAL | Status: DC
  Administered 2021-04-29: 2 via ORAL
  Filled 2021-04-29: qty 2

## 2021-04-29 MED ORDER — ACETAMINOPHEN 325 MG PO TABS: 650.0000 mg | ORAL_TABLET | ORAL | Status: DC | PRN

## 2021-04-29 MED ORDER — LIDOCAINE HCL (PF) 1 % IJ SOLN: 30.0000 mL | INTRAMUSCULAR | Status: DC | PRN

## 2021-04-29 MED ORDER — DIPHENHYDRAMINE HCL 50 MG/ML IJ SOLN: 12.5000 mg | INTRAMUSCULAR | Status: DC | PRN

## 2021-04-29 MED ORDER — LIDOCAINE-EPINEPHRINE (PF) 2 %-1:200000 IJ SOLN
INTRAMUSCULAR | Status: DC | PRN
  Administered 2021-04-29: 5 mL via EPIDURAL

## 2021-04-29 NOTE — Progress Notes (Signed)
Plan of care explained. Safety sheet and call bell explained. Feeding log explained, visitor policy explained. Patient has brace on right knee and uses crutches. I told patient to call for help before she gets up.

## 2021-04-29 NOTE — Discharge Summary (Signed)
Postpartum Discharge Summary     Patient Name: Stefanie King DOB: 12-Feb-2001 MRN: [id number]  Date of admission: 04/29/2021 Delivery date:04/29/2021  Delivering provider: Janet Berlin  Date of discharge: 04/30/2021  Admitting diagnosis: Normal labor [O80, Z37.9] Intrauterine pregnancy: [redacted]w[redacted]d    Secondary diagnosis:  Active Problems:   Normal labor   IUD (intrauterine device) in place  Additional problems: N/A    Discharge diagnosis: Term Pregnancy Delivered                                              Post partum procedures: PP LiIletta Augmentation: AROM Complications: None  Hospital course: Onset of Labor With Vaginal Delivery      20y.o. yo G2P1001 at 378w0das admitted in Active Labor on 04/29/2021. Patient had an uncomplicated labor course as follows:  Membrane Rupture Time/Date: 11:26 AM ,04/29/2021   Delivery Method:Vaginal, Spontaneous  Episiotomy:   Lacerations:  1st degree;Perineal;Periurethral  Patient had an uncomplicated postpartum course.  She is ambulating, tolerating a regular diet, passing flatus, and urinating well. [name] discharged home in stable condition on 04/29/21.  Newborn Data: Birth date:04/29/2021  Birth time:12:11 PM  Gender:Female  Living status:Living  Apgars:8 ,9  Weight:3351 g   Magnesium Sulfate received: No BMZ received: No Rhophylac:N/A MMR:N/A T-DaP:Given prenatally Flu: No Transfusion:No  Physical exam  Vitals:   04/29/21 1201 04/29/21 1206 04/29/21 1222 04/29/21 1231  BP: 108/77  118/79 103/78  Pulse: 94  77 83  Resp:      Temp:      TempSrc:      SpO2: 100% 99%    Weight:      Height:       General: alert, cooperative, and no distress Lochia: appropriate Uterine Fundus: firm Incision: N/A DVT Evaluation: No evidence of DVT seen on physical exam. Labs: Lab Results  Component Value Date   WBC 19.3 (H) 04/29/2021   HGB 11.8 (L) 04/29/2021   HCT 34.9 (L) 04/29/2021   MCV 90.4 04/29/2021   PLT 295  04/29/2021   CMP Latest Ref Rng & Units 04/26/2021  Glucose 70 - 99 mg/dL 76  BUN 6 - 20 mg/dL 6  Creatinine 0.44 - 1.00 mg/dL 0.61  Sodium 135 - 145 mmol/L 136  Potassium 3.5 - 5.1 mmol/L 3.3(L)  Chloride 98 - 111 mmol/L 105  CO2 22 - 32 mmol/L 24  Calcium 8.9 - 10.3 mg/dL 8.6(L)  Total Protein 6.5 - 8.1 g/dL 5.7(L)  Total Bilirubin 0.3 - 1.2 mg/dL 0.4  Alkaline Phos 38 - 126 U/L 127(H)  AST 15 - 41 U/L 16  ALT 0 - 44 U/L 14   Edinburgh Score: Edinburgh Postnatal Depression Scale Screening Tool 12/24/2018  I have been able to laugh and see the funny side of things. 0  I have looked forward with enjoyment to things. 0  I have blamed myself unnecessarily when things went wrong. 1  I have been anxious or worried for no good reason. 1  I have felt scared or panicky for no good reason. 1  Things have been getting on top of me. 1  I have been so unhappy that I have had difficulty sleeping. 1  I have felt sad or miserable. 1  I have been so unhappy that I have been crying. 2  The thought of harming myself has occurred  to me. 0  Edinburgh Postnatal Depression Scale Total 8    After visit meds:  Allergies as of 04/30/2021   No Known Allergies      Medication List     STOP taking these medications    aspirin EC 81 MG tablet   Blood Pressure Kit Devi   fluconazole 150 MG tablet Commonly known as: Diflucan   metoCLOPramide 5 MG tablet Commonly known as: Reglan   ondansetron 4 MG disintegrating tablet Commonly known as: ZOFRAN-ODT   pantoprazole 20 MG tablet Commonly known as: Protonix       TAKE these medications    acetaminophen 325 MG tablet Commonly known as: Tylenol Take 2 tablets (650 mg total) by mouth every 4 (four) hours as needed (for pain scale < 4).   benzocaine-Menthol 20-0.5 % Aero Commonly known as: DERMOPLAST Apply 1 application topically as needed for irritation (perineal discomfort).   ibuprofen 600 MG tablet Commonly known as: ADVIL Take 1  tablet (600 mg total) by mouth every 6 (six) hours.   Prenatal Vitamin 27-0.8 MG Tabs Take 1 tablet by mouth daily.   witch hazel-glycerin pad Commonly known as: TUCKS Apply 1 application topically as needed for hemorrhoids.       Discharge home in stable condition Infant Feeding: Breast Infant Disposition:home with mother Discharge instruction: per After Visit Summary and Postpartum booklet. Activity: Advance as tolerated. Pelvic rest for 6 weeks.  Diet: routine diet Future Appointments: Future Appointments  Date Time Provider [place]  05/05/2021  3:45 PM Woodroe Mode, MD [place] None  05/23/2021  8:30 AM Mordecai Rasmussen, MD [facility] None   Follow up Visit:  Please schedule this patient for a In person postpartum visit in 4 weeks with the following provider: Any provider. Additional Postpartum F/U: IUD string check   Low risk pregnancy complicated by:  hx of gestational HTN in prior preg Delivery mode:  Vaginal, Spontaneous  Anticipated Birth Control:  PP IUD placed    Renee Harder, MSN, CNM 04/30/21 11:53 AM

## 2021-04-29 NOTE — Anesthesia Preprocedure Evaluation (Signed)
Anesthesia Evaluation  Patient identified by MRN, date of birth, ID band Patient awake    Reviewed: Allergy & Precautions, NPO status , Patient's Chart, lab work & pertinent test results  Airway Mallampati: II  TM Distance: >3 FB Neck ROM: Full    Dental no notable dental hx.    Pulmonary neg pulmonary ROS,    Pulmonary exam normal breath sounds clear to auscultation       Cardiovascular hypertension (gHTN), Normal cardiovascular exam Rhythm:Regular Rate:Normal     Neuro/Psych  Headaches, PSYCHIATRIC DISORDERS Anxiety Depression    GI/Hepatic Neg liver ROS, GERD  Medicated,  Endo/Other  negative endocrine ROS  Renal/GU negative Renal ROS  negative genitourinary   Musculoskeletal negative musculoskeletal ROS (+)   Abdominal   Peds  Hematology negative hematology ROS (+)   Anesthesia Other Findings   Reproductive/Obstetrics (+) Pregnancy                             Anesthesia Physical Anesthesia Plan  ASA: 3  Anesthesia Plan: Epidural   Post-op Pain Management:    Induction:   PONV Risk Score and Plan: Treatment may vary due to age or medical condition  Airway Management Planned: Natural Airway  Additional Equipment:   Intra-op Plan:   Post-operative Plan:   Informed Consent: I have reviewed the patients History and Physical, chart, labs and discussed the procedure including the risks, benefits and alternatives for the proposed anesthesia with the patient or authorized representative who has indicated his/her understanding and acceptance.       Plan Discussed with: Anesthesiologist  Anesthesia Plan Comments: (Patient identified. Risks, benefits, options discussed with patient including but not limited to bleeding, infection, nerve damage, paralysis, failed block, incomplete pain control, headache, blood pressure changes, nausea, vomiting, reactions to medication, itching,  and post partum back pain. Confirmed with bedside nurse the patient's most recent platelet count. Confirmed with the patient that they are not taking any anticoagulation, have any bleeding history or any family history of bleeding disorders. Patient expressed understanding and wishes to proceed. All questions were answered. )        Anesthesia Quick Evaluation

## 2021-04-29 NOTE — Progress Notes (Signed)
  Post-Placental IUD Insertion Procedure Note  Patient identified, informed consent signed prior to delivery, signed copy in chart, time out was performed.    Vaginal, labial and perineal areas thoroughly inspected for lacerations. First degree laceration identified - repaired. Liletta - IUD grasped between sterile gloved fingers. Sterile lubrication applied to sterile gloved hand for ease of insertion. Fundus identified through abdominal wall using non-insertion hand. IUD inserted to fundus with bimanual technique. IUD carefully released at the fundus and insertion hand gently removed from vagina.      Strings trimmed to the level of the introitus. Patient tolerated procedure well.  Lot # [id number] Expiration Date 05/16/2024  Patient given post procedure instructions and IUD care card with expiration date.  [name] asked to keep IUD strings tucked in her vagina until her postpartum follow up visit in 4-6 weeks. Patient advised to abstain from sexual intercourse and pulling on strings before her follow-up visit. Patient verbalized an understanding of the plan of care and agrees.    Casper Harrison, MD [facility] Family Medicine Fellow, [facility] for [facility], [facility]

## 2021-04-29 NOTE — Anesthesia Procedure Notes (Signed)
Epidural Patient location during procedure: OB Start time: 04/29/2021 10:15 AM End time: 04/29/2021 10:25 AM  Staffing Anesthesiologist: Elmer Picker, MD Performed: anesthesiologist   Preanesthetic Checklist Completed: patient identified, IV checked, risks and benefits discussed, monitors and equipment checked, pre-op evaluation and timeout performed  Epidural Patient position: sitting Prep: DuraPrep and site prepped and draped Patient monitoring: continuous pulse ox, blood pressure, heart rate and cardiac monitor Approach: midline Location: L3-L4 Injection technique: LOR air  Needle:  Needle type: Tuohy  Needle gauge: 17 G Needle length: 9 cm Needle insertion depth: 6 cm Catheter type: closed end flexible Catheter size: 19 Gauge Catheter at skin depth: 11 cm Test dose: negative  Assessment Sensory level: T8 Events: blood not aspirated, injection not painful, no injection resistance, no paresthesia and negative IV test  Additional Notes Patient identified. Risks/Benefits/Options discussed with patient including but not limited to bleeding, infection, nerve damage, paralysis, failed block, incomplete pain control, headache, blood pressure changes, nausea, vomiting, reactions to medication both or allergic, itching and postpartum back pain. Confirmed with bedside nurse the patient's most recent platelet count. Confirmed with patient that they are not currently taking any anticoagulation, have any bleeding history or any family history of bleeding disorders. Patient expressed understanding and wished to proceed. All questions were answered. Sterile technique was used throughout the entire procedure. Please see nursing notes for vital signs. Test dose was given through epidural catheter and negative prior to continuing to dose epidural or start infusion. Warning signs of high block given to the patient including shortness of breath, tingling/numbness in hands, complete motor block,  or any concerning symptoms with instructions to call for help. Patient was given instructions on fall risk and not to get out of bed. All questions and concerns addressed with instructions to call with any issues or inadequate analgesia.  Reason for block:procedure for pain

## 2021-04-29 NOTE — H&P (Signed)
OBSTETRIC ADMISSION HISTORY AND PHYSICAL  Stefanie King is a 20 y.o. female G2P1001 with IUP at 98w0dby LMP presenting for labor. She reports +FMs, No LOF, no VB, no blurry vision, headaches or peripheral edema, and RUQ pain.  She plans on breast feeding. She request IUD for birth control. She [facility] her [facility] at  [facility] --->  Estimated Date of Delivery: 05/13/21  Sono:    _0 , CWD, normal anatomy, cephalic presentation, 3939Q 85% EFW   Prenatal History/Complications:  -knee dislocation, in immobilizer  -chlamydia with neg TOC 12/14 -hx gHTN     Past Medical History: Past Medical History:  Diagnosis Date   Anxiety    Disordered eating 12/25/2015   Hypertension    Migraines    Pregnancy induced hypertension     Past Surgical History: Past Surgical History:  Procedure Laterality Date   CHOLECYSTECTOMY N/A 10/17/2014   Procedure: LAPAROSCOPIC CHOLECYSTECTOMY WITHOUT INTRAOPERATIVE CHOLANGIOGRAM;  Surgeon: MJerilynn Mages SGerald Stabs MD;  [place]  Service: Pediatrics;  Laterality: N/A;  laparoscopic cholecystectomy   CHOLECYSTECTOMY  2015   DENTAL SURGERY      Obstetrical History: OB History     Gravida  2   Para  1   Term  1   Preterm      AB      Living  1      SAB      IAB      Ectopic      Multiple  0   Live Births  1           Social History Social History   Socioeconomic History   Marital status: Single    Spouse name: Not on file   Number of children: Not on file   Years of education: Not on file   Highest education level: Not on file  Occupational History   Occupation: Unemployed  Tobacco Use   Smoking status: Never   Smokeless tobacco: Never   Tobacco comments:    last marijuana smoked 06-25-18  Vaping Use   Vaping Use: Never used  Substance and Sexual Activity   Alcohol use: No    Alcohol/week: 0.0 standard drinks   Drug use: Not Currently    Types: Marijuana    Comment: 01-2021   Sexual  activity: Yes    Partners: Male    Birth control/protection: None  Other Topics Concern   Not on file  Social History Narrative   BRomayneis a 11th grade student.   She attends [employer].    She lives with her mother, her brother, and her 2 sisters.    She enjoys drawing, yoga, and music.   Social Determinants of Health   Financial Resource Strain: Not on file  Food Insecurity: Not on file  Transportation Needs: Not on file  Physical Activity: Not on file  Stress: Not on file  Social Connections: Not on file    Family History: Family History  Problem Relation Age of Onset   Alcohol abuse Father    Hypertension Father    Cancer Paternal Grandmother    Asthma Neg Hx    Depression Neg Hx    Diabetes Neg Hx    Drug abuse Neg Hx    Early death Neg Hx    Hearing loss Neg Hx    Heart disease Neg Hx    Hyperlipidemia Neg Hx    Kidney disease Neg Hx    Mental  illness Neg Hx    Mental retardation Neg Hx    Stroke Neg Hx     Allergies: No Known Allergies  Medications Prior to Admission  Medication Sig Dispense Refill Last Dose   acetaminophen (TYLENOL) 325 MG tablet Take 2 tablets (650 mg total) by mouth every 4 (four) hours as needed (for pain scale < 4). 30 tablet 0    aspirin EC 81 MG tablet Take 1 tablet (81 mg total) by mouth daily. Swallow whole. 30 tablet 2    Blood Pressure Monitoring (BLOOD PRESSURE KIT) DEVI 1 kit by Does not apply route once a week. Check Blood Pressure regularly and record readings into the Babyscripts App.  Large Cuff.  DX O90.0 1 each 0    fluconazole (DIFLUCAN) 150 MG tablet Take 1 tablet (150 mg total) by mouth daily. (Patient not taking: Reported on 04/28/2021) 1 tablet 0    metoCLOPramide (REGLAN) 5 MG tablet Take 1 tablet (5 mg total) by mouth every 8 (eight) hours as needed for up to 30 doses for nausea. (Patient not taking: Reported on 04/15/2021) 30 tablet 3    ondansetron (ZOFRAN-ODT) 4 MG disintegrating tablet Take by mouth.       pantoprazole (PROTONIX) 20 MG tablet Take 1 tablet (20 mg total) by mouth daily. (Patient not taking: Reported on 04/15/2021) 30 tablet 3    Prenatal Vit-Fe Fumarate-FA (PRENATAL VITAMIN) 27-0.8 MG TABS Take 1 tablet by mouth daily. 90 tablet 3      Review of Systems   All systems reviewed and negative except as stated in HPI  Last menstrual period 08/06/2020, currently breastfeeding. General appearance: alert, cooperative, and appears stated age Lungs: clear to auscultation bilaterally Heart: regular rate and rhythm Abdomen: soft, non-tender; bowel sounds normal Extremities: Homans sign is negative, no sign of DVT  Presentation: cephalic Fetal monitoring baseline 140, mod variability, pos accels, no decels Uterine activity q2-3 min  Dilation: 7.5 Effacement (%): 100 Exam by:: jolynn   Prenatal labs: ABO, Rh: A/Positive/-- (12/13 1414) Antibody: Negative (12/13 1414) Rubella: 1.71 (12/13 1414) RPR: Non Reactive (03/30 1056)  HBsAg: Negative (12/13 1414)  HIV: Non Reactive (03/30 1056)  GBS: Negative/-- (05/31 0405)  2 hr Glucola normal Genetic screening  low risk female Anatomy US normal  Prenatal Transfer Tool  Maternal Diabetes: No Genetic Screening: Normal Maternal Ultrasounds/Referrals: Normal Fetal Ultrasounds or other Referrals:  None Maternal Substance Abuse:  No Significant Maternal Medications:  None Significant Maternal Lab Results: Group B Strep negative  No results found for this or any previous visit (from the past 24 hour(s)).  Patient Active Problem List   Diagnosis Date Noted   Vaginal yeast infection 04/15/2021   History of gestational hypertension 10/28/2020   Supervision of other normal pregnancy, antepartum 10/21/2020   Chlamydia infection affecting pregnancy 10/07/2020   Severe recurrent major depression without psychotic features ([place]) 03/11/2018   Marijuana abuse 10/21/2017   Adjustment disorder with mixed anxiety and depressed mood  01/13/2016   Migraine without aura and without status migrainosus, not intractable 01/01/2016   Disordered eating 12/25/2015   GERD (gastroesophageal reflux disease) 09/17/2015   Acne vulgaris 08/06/2015    Assessment/Plan:  Darlean Warmoth is a 20 y.o. G2P1001 at 70w0dhere for labor  #Labor: expectant mgmt #Pain: Attempt to get epidural prior to delivery #FWB: Cat I  #ID:  Gbs neg #MOF: breast #MOC:iud  JJanet Berlin MD  04/29/2021, 8:55 AM

## 2021-04-30 MED ORDER — IBUPROFEN 600 MG PO TABS: 600.0000 mg | ORAL_TABLET | Freq: Four times a day (QID) | ORAL | 0 refills | Status: DC

## 2021-04-30 MED ORDER — BENZOCAINE-MENTHOL 20-0.5 % EX AERO: 1.0000 "application " | INHALATION_SPRAY | CUTANEOUS | 0 refills | Status: DC | PRN

## 2021-04-30 MED ORDER — WITCH HAZEL-GLYCERIN EX PADS: 1.0000 "application " | MEDICATED_PAD | CUTANEOUS | 12 refills | Status: DC | PRN

## 2021-04-30 MED ORDER — OXYCODONE HCL 5 MG PO TABS
5.0000 mg | ORAL_TABLET | Freq: Once | ORAL | Status: AC
  Administered 2021-04-30: 5 mg via ORAL
  Filled 2021-04-30: qty 1

## 2021-04-30 NOTE — Progress Notes (Signed)
Patient was given oxy at 0321 due to severe abdominal cramping during and after breastfeeding. RN contacted Dr. Farris Has for order for pain medication. The physician put in order for one time 5mg   dose of oxy.

## 2021-04-30 NOTE — Clinical Social Work Maternal (Signed)
CLINICAL SOCIAL WORK MATERNAL/CHILD NOTE  Patient Details  Name: Stefanie King MRN: [id number] Date of Birth: 04/29/2022  Date:  04/30/2021  Clinical Social Worker Initiating Note:  Darra Lis, MSW, [place] Date/Time: Initiated:  04/30/21/1030     Child's Name:  Stefanie King   Biological Parents:  Mother   Need for Interpreter:  None   Reason for Referral:  Behavioral Health Concerns, Current Substance Use/Substance Use During Pregnancy     Address:  [address] [place] [address]    Phone number:  [phone] (home)     Additional phone number:   Household Members/Support Persons (HM/SP):   Household Member/Support Person 1, Household Member/Support Person 2, Household Member/Support Person 3, Household Member/Support Person 4, Household Member/Support Person 5   HM/SP Name Relationship DOB or Age  HM/SP -1 Stefanie King 12/22/1998  HM/SP -2 Stefanie King Sister 53  HM/SP -Stefanie King 11  HM/SP -4 Stefanie King Sister 9  HM/SP -5 Stefanie King Mother    HM/SP -6        HM/SP -7        HM/SP -8          Natural Supports (not living in the home):  Immediate Family   Professional Supports: None   Employment: Unemployed   Type of Work:     Education:  Other (comment) (12th Grade)   Homebound arranged:    Museum/gallery curator Resources:  Medicaid   Other Resources:  Arts development officer Considerations Which May Impact Care:    Strengths:  Ability to meet basic needs  , Lexicographer chosen, Home prepared for child     Psychotropic Medications:         Pediatrician:    [place] area  Pediatrician List:   [facility] for [place]      Pediatrician Fax Number:    Risk Factors/Current Problems:  Substance Use  , Mental Health Concerns     Cognitive State:  Alert  , Linear Thinking      Mood/Affect:  Relaxed  , Interested     CSW Assessment: CSW consulted for history of anxiety and depression. CSW reviewed chart and also notes THC use during pregnancy. CSW met with MOB to assess and offer support. CSW observed infant sleeping in bassinet. CSW introduced self and informed MOB of reason for consult. MOB was pleasant and disclosed she used THC about two weeks ago following the dislocation of her knee. MOB stated she was not given any medication and used THC to cope with the pain. MOB reported that is the only time she used [facility] and denies any additional substance use. CSW informed MOB of the hospital drug screen policy. MOB aware a CDS will be performed and a CPS report will be made if infant test positive. MOB denies any previous CPS history and denied having any questions. UDS not performed due to the age of infant.   CSW assessed MOB current emotions and inquired on her mental health history. MOB reported she is currently doing well and had a good pregnancy. MOB stated she was diagnosed with anxiety and depression as a teen. MOB denies experiencing any symptoms during pregnancy. MOB stated she has never been on medication, however she attended therapy at a young age. MOB did not acknowledge previous [place] involvement. MOB expressed no  current mental health concerns. MOB stated her mother and grandmother are her primary supports. MOB declined to provide FOB name, however she disclosed that he is currently stationed in [place] and will be returning this year. MOB denies any current SI, HI or DV.  MOB stated she resides with her mother, three sibling and 43 year old son. MOB is under her mom's food stamps and reported she plans to apply for [facility].   CSW provided education regarding the baby blues period versus PPD and offered resources, which MOB declined. CSW provided the New Mom Checklist and encouraged MOB to self evaluate and contact a medical professional if symptoms are noted at any time.    CSW provided review of Sudden Infant Death Syndrome (SIDS) precautions. MOB stated infant will sleep in a bedside crib. MOB reported she has all essentials for infant, including a car seat. MOB denies any barriers to follow-up care. MOB denies having any additional needs at this time.  CSW will continue to follow CDS and make a CPS report if warranted. CSW identifies no further need for intervention and no barriers to discharge at this time.  CSW Plan/Description:  CSW Will Continue to Monitor Umbilical Cord Tissue Drug Screen Results and Make Report if Warranted, Child Protective Service Report  , Hospital Drug Screen Policy Information, Perinatal Mood and Anxiety Disorder (PMADs) Education, No Further Intervention Required/No Barriers to Discharge, Sudden Infant Death Syndrome (SIDS) Education, Other Information/Referral to [employer], Other Patient/Family Education    Waylan Boga, [place] 04/30/2021, 11:14 AM

## 2021-05-01 ENCOUNTER — Telehealth: Payer: Self-pay

## 2021-05-01 NOTE — Telephone Encounter (Signed)
Transition Care Management Unsuccessful Follow-up Telephone Call  Date of discharge and from where:  04/30/2021 from [facility]  Attempts:  1st Attempt  Reason for unsuccessful TCM follow-up call:  Unable to leave message

## 2021-05-01 NOTE — Anesthesia Postprocedure Evaluation (Signed)
Anesthesia Post Note  Patient: Stefanie King  Procedure(s) Performed: AN AD HOC LABOR EPIDURAL     Patient location during evaluation: Mother Baby Anesthesia Type: Epidural Level of consciousness: awake, awake and alert and oriented Pain management: pain level controlled Vital Signs Assessment: post-procedure vital signs reviewed and stable Respiratory status: spontaneous breathing Cardiovascular status: blood pressure returned to baseline and stable Postop Assessment: no headache, epidural receding and no apparent nausea or vomiting Anesthetic complications: no   No notable events documented.  Last Vitals: There were no vitals filed for this visit.  Last Pain: There were no vitals filed for this visit.               [name] [name]

## 2021-05-02 NOTE — Telephone Encounter (Signed)
Transition Care Management Unsuccessful Follow-up Telephone Call  Date of discharge and from where:  04/30/2021 - [facility]  Attempts:  2nd Attempt  Reason for unsuccessful TCM follow-up call:  Unable to leave message

## 2021-05-05 ENCOUNTER — Encounter: Payer: Medicaid Other | Admitting: Obstetrics & Gynecology

## 2021-05-05 NOTE — Telephone Encounter (Signed)
Transition Care Management Unsuccessful Follow-up Telephone Call  Date of discharge and from where:  04/30/2021 from [facility]  Attempts:  3rd Attempt  Reason for unsuccessful TCM follow-up call:  Unable to reach patient

## 2021-05-21 ENCOUNTER — Encounter: Payer: Self-pay | Admitting: Orthopedic Surgery

## 2021-05-21 ENCOUNTER — Other Ambulatory Visit: Payer: Self-pay

## 2021-05-21 ENCOUNTER — Ambulatory Visit (INDEPENDENT_AMBULATORY_CARE_PROVIDER_SITE_OTHER): Payer: Medicaid Other | Admitting: Orthopedic Surgery

## 2021-05-21 VITALS — BP 128/87 | HR 96 | Ht 63.0 in | Wt 130.0 lb

## 2021-05-21 DIAGNOSIS — S83004D Unspecified dislocation of right patella, subsequent encounter: Secondary | ICD-10-CM | POA: Diagnosis not present

## 2021-05-21 NOTE — Progress Notes (Signed)
Orthopaedic Clinic Return  Assessment: Stefanie King is a 20 y.o. female with the following: Right patella dislocation  Plan: Overall, she is doing very well.  Her knee feels better.  She does continue to have some apprehension, continues to wear a hinged knee brace, including when sleeping.  She started working on range of motion.  I provided her with some simple home exercises to work on, as it feels improving her strength will allow her to avoid surgery in the future.  If she has difficulty completing these exercises, we could prescribe formal physical therapy.  In addition, she has delivered her second child, and notes that the pain and similar symptoms have improved since giving birth.  This could be related to her physiology, as the hormonal change in her ligaments and tendons become less pliable.  We will plan to see her back in 2-3 months for repeat evaluation.  If she has any issues in the interim, she should contact clinic.   Follow-up: Return for 2-3 months.   Subjective:  Chief Complaint  Patient presents with   closed dislocation of right patella    Follow up    History of Present Illness: Stefanie King is a 20 y.o. female who returns to clinic for repeat evaluation of right knee pain.  It is approximately 1 month since she sustained a right patella dislocation.  She previously had multiple dislocations, but this was her first dislocation in several years.  Since he was last seen in clinic, she has delivered her second child.  Since then, the pain and apprehension her knee has improved.  She has continued to use a hinged knee brace, including sleeping at night.  She has been doing some simple exercises to regain the range of motion in her right knee.  She not taking medication on a consistent basis.  Review of Systems: No fevers or chills No numbness or tingling No chest pain No shortness of breath No bowel or bladder dysfunction No GI distress No  headaches   Objective: BP 128/87   Pulse 96   Ht 5\' 3"  (1.6 m)   Wt 130 lb (59 kg)   BMI 23.03 kg/m   Physical Exam:  Evaluation of right knee demonstrates a mild effusion.  Minimal tenderness to palpation along the medial aspect of the patella.  Easily able to get to full extension, with flexion beyond 100 degrees.  Patella is tracking normally.  Minimal apprehension with attempted patellar translation.  There is no obvious buttress to lateral translation.  Walks with a nonantalgic gait.  IMAGING: I personally ordered and reviewed the following images:  No new imaging obtained today.  , MD 05/21/2021 11:10 AM

## 2021-05-23 ENCOUNTER — Ambulatory Visit: Payer: Medicaid Other | Admitting: Orthopedic Surgery

## 2021-05-28 ENCOUNTER — Ambulatory Visit: Payer: Medicaid Other | Admitting: Women's Health

## 2021-07-22 ENCOUNTER — Encounter: Payer: Self-pay | Admitting: Orthopedic Surgery

## 2021-07-22 ENCOUNTER — Other Ambulatory Visit: Payer: Self-pay

## 2021-07-22 ENCOUNTER — Ambulatory Visit (INDEPENDENT_AMBULATORY_CARE_PROVIDER_SITE_OTHER): Payer: Medicaid Other | Admitting: Orthopedic Surgery

## 2021-07-22 VITALS — BP 110/62 | HR 60 | Ht 63.0 in | Wt 128.0 lb

## 2021-07-22 DIAGNOSIS — S83004D Unspecified dislocation of right patella, subsequent encounter: Secondary | ICD-10-CM | POA: Diagnosis not present

## 2021-07-22 NOTE — Progress Notes (Signed)
Orthopaedic Clinic Return  Assessment: Stefanie King is a 20 y.o. female with the following: Right patella dislocation  Plan: Patient reports she is doing well overall.  She does have some pain in her right knee, but denies any recent dislocations or subluxation events.  She is now doing exercises on her own.  We briefly discussed the possibility of proceeding with surgery, which may or may not include both soft tissue and bony procedures.  At this time, if she is doing well, she can continue with her current regimen.  No follow-up is needed at this time.  However, if her symptoms persist, or worsen, she can return to clinic.   Follow-up: Return if symptoms worsen or fail to improve.   Subjective:  Chief Complaint  Patient presents with   Knee Pain    Pt states having more improvements than problems with Rt knee dislocations.     History of Present Illness: Stefanie King is a 20 y.o. female who returns to clinic for repeat evaluation of right knee pain.  Briefly, she sustained a right patella dislocation approximately 3 months ago.  She states that she has a history of injuries to her right knee.  The most recent event occurred just prior to giving birth to her youngest child.  She reports she is doing well overall.  Her pain is improving.  She does have occasional discomfort in the knee.  However, she states that she has had knee issues for several years.  She is now doing exercises on her own.  She is not continuing with physical therapy.  She is not wearing a brace.  Review of Systems: No fevers or chills No numbness or tingling No chest pain No shortness of breath No bowel or bladder dysfunction No GI distress No headaches   Objective: BP 110/62   Pulse 60   Ht 5\' 3"  (1.6 m)   Wt 128 lb (58.1 kg)   BMI 22.67 kg/m   Physical Exam:  Evaluation of right knee demonstrates a mild effusion.  Minimal tenderness to palpation along the medial aspect of the patella.  She can  achieve full extension.  She states she is lacking some flexion, but currently she is greater than 120 degrees of flexion.  Patella is tracking normally.  Minimal apprehension with attempted patellar translation.  There is no obvious buttress to lateral translation.  Walks with a nonantalgic gait.  IMAGING: I personally ordered and reviewed the following images:  No new imaging obtained today.  , MD 07/22/2021 11:03 AM

## 2021-08-27 ENCOUNTER — Encounter: Payer: Self-pay | Admitting: Emergency Medicine

## 2021-08-27 ENCOUNTER — Other Ambulatory Visit: Payer: Self-pay

## 2021-08-27 DIAGNOSIS — Z79899 Other long term (current) drug therapy: Secondary | ICD-10-CM | POA: Diagnosis not present

## 2021-08-27 DIAGNOSIS — R001 Bradycardia, unspecified: Secondary | ICD-10-CM | POA: Diagnosis not present

## 2021-08-27 DIAGNOSIS — R1032 Left lower quadrant pain: Secondary | ICD-10-CM | POA: Diagnosis not present

## 2021-08-27 DIAGNOSIS — I10 Essential (primary) hypertension: Secondary | ICD-10-CM | POA: Diagnosis not present

## 2021-08-27 DIAGNOSIS — Z7982 Long term (current) use of aspirin: Secondary | ICD-10-CM | POA: Insufficient documentation

## 2021-08-27 DIAGNOSIS — N12 Tubulo-interstitial nephritis, not specified as acute or chronic: Secondary | ICD-10-CM | POA: Diagnosis not present

## 2021-08-27 DIAGNOSIS — N3001 Acute cystitis with hematuria: Secondary | ICD-10-CM | POA: Insufficient documentation

## 2021-08-27 DIAGNOSIS — R109 Unspecified abdominal pain: Secondary | ICD-10-CM | POA: Diagnosis not present

## 2021-08-27 DIAGNOSIS — Z975 Presence of (intrauterine) contraceptive device: Secondary | ICD-10-CM | POA: Diagnosis not present

## 2021-08-27 DIAGNOSIS — Z9049 Acquired absence of other specified parts of digestive tract: Secondary | ICD-10-CM | POA: Diagnosis not present

## 2021-08-27 LAB — BASIC METABOLIC PANEL
Anion gap: 8 (ref 5–15)
BUN: 9 mg/dL (ref 6–20)
CO2: 26 mmol/L (ref 22–32)
Calcium: 8.8 mg/dL — ABNORMAL LOW (ref 8.9–10.3)
Chloride: 102 mmol/L (ref 98–111)
Creatinine, Ser: 0.76 mg/dL (ref 0.44–1.00)
GFR, Estimated: >60 mL/min (ref >60)
Glucose, Bld: 96 mg/dL (ref 70–99)
Potassium: 3.7 mmol/L (ref 3.5–5.1)
Sodium: 136 mmol/L (ref 135–145)

## 2021-08-27 LAB — URINALYSIS, COMPLETE (UACMP) WITH MICROSCOPIC
Bacteria, UA: NONE SEEN
Bilirubin Urine: NEGATIVE
Glucose, UA: NEGATIVE mg/dL
Ketones, ur: NEGATIVE mg/dL
Nitrite: NEGATIVE
Protein, ur: 100 mg/dL — AB
Specific Gravity, Urine: 1.013 (ref 1.005–1.030)
WBC, UA: >50 WBC/hpf — ABNORMAL HIGH (ref 0–5)
pH: 6 (ref 5.0–8.0)

## 2021-08-27 LAB — CBC
HCT: 38.2 % (ref 36.0–46.0)
Hemoglobin: 13.4 g/dL (ref 12.0–15.0)
MCH: 31.1 pg (ref 26.0–34.0)
MCHC: 35.1 g/dL (ref 30.0–36.0)
MCV: 88.6 fL (ref 80.0–100.0)
Platelets: 310 10*3/uL (ref 150–400)
RBC: 4.31 MIL/uL (ref 3.87–5.11)
RDW: 14.5 % (ref 11.5–15.5)
WBC: 10.2 10*3/uL (ref 4.0–10.5)
nRBC: 0 % (ref 0.0–0.2)

## 2021-08-27 LAB — HCG, QUANTITATIVE, PREGNANCY: hCG, Beta Chain, Quant, S: <1 m[IU]/mL (ref <5)

## 2021-08-27 LAB — POC URINE PREG, ED: Preg Test, Ur: NEGATIVE

## 2021-08-27 NOTE — ED Triage Notes (Signed)
Pt to ED from home c/o left flank pain x1 week that got severely worse last two days.  States noticed some hematuria but unsure if started period, denies odors or discharge.  Hx of kidney stones and feels similar.  Nausea and vomiting x2 today.  States recent pregnancy 2 days ago.  A&Ox4, chest rise even and unlabored in NAD at this time.

## 2021-08-28 ENCOUNTER — Emergency Department
Admission: EM | Admit: 2021-08-28 | Discharge: 2021-08-28 | Disposition: A | Discharge status: Home / Self Care | Payer: Medicaid Other | Attending: Emergency Medicine | Admitting: Emergency Medicine

## 2021-08-28 ENCOUNTER — Emergency Department: Payer: Medicaid Other

## 2021-08-28 DIAGNOSIS — N3001 Acute cystitis with hematuria: Secondary | ICD-10-CM

## 2021-08-28 DIAGNOSIS — R1032 Left lower quadrant pain: Secondary | ICD-10-CM | POA: Diagnosis not present

## 2021-08-28 DIAGNOSIS — R109 Unspecified abdominal pain: Secondary | ICD-10-CM

## 2021-08-28 DIAGNOSIS — Z975 Presence of (intrauterine) contraceptive device: Secondary | ICD-10-CM | POA: Diagnosis not present

## 2021-08-28 DIAGNOSIS — R001 Bradycardia, unspecified: Secondary | ICD-10-CM | POA: Diagnosis not present

## 2021-08-28 DIAGNOSIS — Z9049 Acquired absence of other specified parts of digestive tract: Secondary | ICD-10-CM | POA: Diagnosis not present

## 2021-08-28 DIAGNOSIS — N12 Tubulo-interstitial nephritis, not specified as acute or chronic: Secondary | ICD-10-CM | POA: Diagnosis not present

## 2021-08-28 LAB — LACTIC ACID, PLASMA: Lactic Acid, Venous: 0.9 mmol/L (ref 0.5–1.9)

## 2021-08-28 MED ORDER — ONDANSETRON HCL 4 MG/2ML IJ SOLN: 4.0000 mg | Freq: Once | INTRAMUSCULAR | Status: DC

## 2021-08-28 MED ORDER — MORPHINE SULFATE (PF) 4 MG/ML IV SOLN
4.0000 mg | Freq: Once | INTRAVENOUS | Status: AC
  Administered 2021-08-28: 4 mg via INTRAVENOUS
  Filled 2021-08-28: qty 1

## 2021-08-28 MED ORDER — IBUPROFEN 600 MG PO TABS: 600.0000 mg | ORAL_TABLET | Freq: Three times a day (TID) | ORAL | 0 refills | Status: DC | PRN

## 2021-08-28 MED ORDER — SODIUM CHLORIDE 0.9 % IV SOLN
2.0000 g | Freq: Once | INTRAVENOUS | Status: AC
  Administered 2021-08-28: 2 g via INTRAVENOUS
  Filled 2021-08-28: qty 20

## 2021-08-28 MED ORDER — CEFDINIR 300 MG PO CAPS: 300.0000 mg | ORAL_CAPSULE | Freq: Two times a day (BID) | ORAL | 0 refills | Status: DC

## 2021-08-28 MED ORDER — KETOROLAC TROMETHAMINE 30 MG/ML IJ SOLN
15.0000 mg | Freq: Once | INTRAMUSCULAR | Status: AC
  Administered 2021-08-28: 15 mg via INTRAVENOUS
  Filled 2021-08-28: qty 1

## 2021-08-28 MED ORDER — ONDANSETRON 4 MG PO TBDP: 4.0000 mg | ORAL_TABLET | Freq: Three times a day (TID) | ORAL | 0 refills | Status: DC | PRN

## 2021-08-28 MED ORDER — ONDANSETRON HCL 4 MG/2ML IJ SOLN
4.0000 mg | Freq: Once | INTRAMUSCULAR | Status: AC
  Administered 2021-08-28: 4 mg via INTRAVENOUS
  Filled 2021-08-28: qty 2

## 2021-08-28 MED ORDER — LACTATED RINGERS IV BOLUS
1000.0000 mL | Freq: Once | INTRAVENOUS | Status: AC
  Administered 2021-08-28: 1000 mL via INTRAVENOUS

## 2021-08-28 MED ORDER — HYDROCODONE-ACETAMINOPHEN 5-325 MG PO TABS: 1.0000 | ORAL_TABLET | Freq: Four times a day (QID) | ORAL | 0 refills | Status: AC | PRN

## 2021-08-28 NOTE — ED Provider Notes (Signed)
[facility] Emergency Department Provider Note  ____________________________________________   Event Date/Time   First MD Initiated Contact with Patient 08/28/21 0105     (approximate)  I have reviewed the triage vital signs and the nursing notes.   HISTORY  Chief Complaint Flank Pain    HPI Stefanie King is a 20 y.o. female with past medical history as below here with left flank pain.  The patient states that her symptoms started fairly acutely with left flank pain about a week ago.  Her pain has been fairly consistent since then.  She initially had some right flank pain as well but this is primarily localized to the left flank pain of the last several days.  She denies any associated fevers but has had some chills.  The pain is aching, gnawing, worse with certain movements.  No alleviating factors.  She has a history of kidney stones with similar pain, though that was fairly more acute in onset.  Denies any overt urinary symptoms.  No vaginal bleeding or discharge.  She delivered vaginally 4 months ago but has had no issues since then.  No other complaints.    Past Medical History:  Diagnosis Date   Anxiety    Disordered eating 12/25/2015   Hypertension    Migraines    Pregnancy induced hypertension     Patient Active Problem List   Diagnosis Date Noted   Normal labor 04/29/2021   IUD (intrauterine device) in place 04/29/2021   Vaginal yeast infection 04/15/2021   History of gestational hypertension 10/28/2020   Supervision of other normal pregnancy, antepartum 10/21/2020   Chlamydia infection affecting pregnancy 10/07/2020   Severe recurrent major depression without psychotic features (HCC) 03/11/2018   Marijuana abuse 10/21/2017   Adjustment disorder with mixed anxiety and depressed mood 01/13/2016   Migraine without aura and without status migrainosus, not intractable 01/01/2016   Disordered eating 12/25/2015   GERD (gastroesophageal reflux  disease) 09/17/2015   Acne vulgaris 08/06/2015    Past Surgical History:  Procedure Laterality Date   CHOLECYSTECTOMY N/A 10/17/2014   Procedure: LAPAROSCOPIC CHOLECYSTECTOMY WITHOUT INTRAOPERATIVE CHOLANGIOGRAM;  Surgeon: Judie Petit. Leonia Corona, MD;  Location: [facility] OR;  Service: Pediatrics;  Laterality: N/A;  laparoscopic cholecystectomy   CHOLECYSTECTOMY  2015   DENTAL SURGERY      Prior to Admission medications   Medication Sig Start Date End Date Taking? Authorizing Provider  cefdinir (OMNICEF) 300 MG capsule Take 1 capsule (300 mg total) by mouth 2 (two) times daily for 10 days. 08/28/21 09/07/21 Yes Shaune Pollack, MD  HYDROcodone-acetaminophen (NORCO/VICODIN) 5-325 MG tablet Take 1 tablet by mouth every 6 (six) hours as needed for severe pain. 08/28/21 08/28/22 Yes Shaune Pollack, MD  ibuprofen (ADVIL) 600 MG tablet Take 1 tablet (600 mg total) by mouth every 8 (eight) hours as needed for mild pain or moderate pain. 08/28/21  Yes Shaune Pollack, MD  ondansetron (ZOFRAN ODT) 4 MG disintegrating tablet Take 1 tablet (4 mg total) by mouth every 8 (eight) hours as needed for nausea or vomiting. 08/28/21  Yes Shaune Pollack, MD  acetaminophen (TYLENOL) 325 MG tablet Take 2 tablets (650 mg total) by mouth every 4 (four) hours as needed (for pain scale < 4). 12/23/18   Kellogg, Alexandra L, DO  ASPIRIN 81 PO Take 81 mg by mouth daily. 04/28/21   [provider]  benzocaine-Menthol (DERMOPLAST) 20-0.5 % AERO Apply 1 application topically as needed for irritation (perineal discomfort). 04/30/21   Brand Males, CNM  Prenatal Vit-Fe Fumarate-FA (PRENATAL VITAMIN) 27-0.8 MG TABS Take 1 tablet by mouth daily. 05/04/18   Verneda Skill, FNP  witch hazel-glycerin (TUCKS) pad Apply 1 application topically as needed for hemorrhoids. 04/30/21   Brand Males, CNM    Allergies Patient has no known allergies.  Family History  Problem Relation Age of Onset   Alcohol abuse Father     Hypertension Father    Cancer Paternal Grandmother    Asthma Neg Hx    Depression Neg Hx    Diabetes Neg Hx    Drug abuse Neg Hx    Early death Neg Hx    Hearing loss Neg Hx    Heart disease Neg Hx    Hyperlipidemia Neg Hx    Kidney disease Neg Hx    Mental illness Neg Hx    Mental retardation Neg Hx    Stroke Neg Hx     Social History Social History   Tobacco Use   Smoking status: Never   Smokeless tobacco: Never   Tobacco comments:    last marijuana smoked 06-25-18  Vaping Use   Vaping Use: Every day  Substance Use Topics   Alcohol use: No    Alcohol/week: 0.0 standard drinks   Drug use: Not Currently    Types: Marijuana    Comment: 01-2021    Review of Systems  Review of Systems  Constitutional:  Negative for fatigue and fever.  HENT:  Negative for congestion and sore throat.   Eyes:  Negative for visual disturbance.  Respiratory:  Negative for cough and shortness of breath.   Cardiovascular:  Negative for chest pain.  Gastrointestinal:  Positive for abdominal pain and nausea. Negative for diarrhea and vomiting.  Genitourinary:  Positive for flank pain and frequency.  Musculoskeletal:  Negative for back pain and neck pain.  Skin:  Negative for rash and wound.  Neurological:  Negative for weakness.  All other systems reviewed and are negative.   ____________________________________________  PHYSICAL EXAM:      VITAL SIGNS: ED Triage Vitals  Enc Vitals Group     BP 08/27/21 2215 113/74     Pulse Rate 08/27/21 2215 65     Resp 08/27/21 2215 16     Temp 08/27/21 2215 98.4 F (36.9 C)     Temp Source 08/27/21 2215 Oral     SpO2 08/27/21 2215 97 %     Weight 08/27/21 2227 120 lb (54.4 kg)     Height 08/27/21 2227 5\' 3"  (1.6 m)     Head Circumference --      Peak Flow --      Pain Score 08/27/21 2227 6     Pain Loc --      Pain Edu? --      Excl. in GC? --      Physical Exam Vitals and nursing note reviewed.  Constitutional:      General: She is  not in acute distress.    Appearance: She is well-developed.  HENT:     Head: Normocephalic and atraumatic.  Eyes:     Conjunctiva/sclera: Conjunctivae normal.  Cardiovascular:     Rate and Rhythm: Normal rate and regular rhythm.     Heart sounds: Normal heart sounds. No murmur heard.   No friction rub.  Pulmonary:     Effort: Pulmonary effort is normal. No respiratory distress.     Breath sounds: Normal breath sounds. No wheezing or rales.  Abdominal:     General:  There is no distension.     Palpations: Abdomen is soft.     Tenderness: There is no abdominal tenderness (minimal left flank).  Musculoskeletal:     Cervical back: Neck supple.  Skin:    General: Skin is warm.     Capillary Refill: Capillary refill takes less than 2 seconds.  Neurological:     Mental Status: She is alert and oriented to person, place, and time.     Motor: No abnormal muscle tone.      ____________________________________________   LABS (all labs ordered are listed, but only abnormal results are displayed)  Labs Reviewed  URINALYSIS, COMPLETE (UACMP) WITH MICROSCOPIC - Abnormal; Notable for the following components:      Result Value   Color, Urine YELLOW (*)    APPearance CLOUDY (*)    Hgb urine dipstick SMALL (*)    Protein, ur 100 (*)    Leukocytes,Ua LARGE (*)    WBC, UA >50 (*)    Non Squamous Epithelial PRESENT (*)    All other components within normal limits  BASIC METABOLIC PANEL - Abnormal; Notable for the following components:   Calcium 8.8 (*)    All other components within normal limits  URINE CULTURE  CULTURE, BLOOD (SINGLE)  CBC  HCG, QUANTITATIVE, PREGNANCY  LACTIC ACID, PLASMA  LACTIC ACID, PLASMA  POC URINE PREG, ED  POC URINE PREG, ED    ____________________________________________  EKG: Sinus bradycardia, ventricular rate 46.  PR 173, QRS 88, QTc 394.  No acute ST elevations or depressions.  No EKG evidence of acute ischemia or  infarct. ________________________________________  RADIOLOGY All imaging, including plain films, CT scans, and ultrasounds, independently reviewed by me, and interpretations confirmed via formal radiology reads.  ED MD interpretation:   CT Stone: No acute abnormality  Official radiology report(s): CT Renal Stone Study  Result Date: 08/28/2021 CLINICAL DATA:  Left flank pain EXAM: CT ABDOMEN AND PELVIS WITHOUT CONTRAST TECHNIQUE: Multidetector CT imaging of the abdomen and pelvis was performed following the standard protocol without IV contrast. COMPARISON:  03/03/2018 FINDINGS: LOWER CHEST: Normal. HEPATOBILIARY: Normal hepatic contours. No intra- or extrahepatic biliary dilatation. Status post cholecystectomy. PANCREAS: Normal pancreas. No ductal dilatation or peripancreatic fluid collection. SPLEEN: Normal. ADRENALS/URINARY TRACT: The adrenal glands are normal. No hydronephrosis, nephroureterolithiasis or solid renal mass. The urinary bladder is normal for degree of distention STOMACH/BOWEL: There is no hiatal hernia. Normal duodenal course and caliber. No small bowel dilatation or inflammation. No focal colonic abnormality. Normal appendix. VASCULAR/LYMPHATIC: Normal course and caliber of the major abdominal vessels. No abdominal or pelvic lymphadenopathy. REPRODUCTIVE: There is a T-shaped contraceptive device within the uterus. MUSCULOSKELETAL. No bony spinal canal stenosis or focal osseous abnormality. OTHER: None. IMPRESSION: No acute abnormality of the abdomen or pelvis. Electronically Signed   By: Deatra Robinson M.D.   On: 08/28/2021 02:30    ____________________________________________  PROCEDURES   Procedure(s) performed (including Critical Care):  .1-3 Lead EKG Interpretation Performed by: Shaune Pollack, MD Authorized by: Shaune Pollack, MD     Interpretation: normal     ECG rate:  45-65   ECG rate assessment: bradycardic     Rhythm: sinus bradycardia     Ectopy: none      Conduction: normal   Comments:     Indication: Reported chest pain after receiving morphine, likely related to the medication but placed on telemetry  ____________________________________________  INITIAL IMPRESSION / MDM / ASSESSMENT AND PLAN / ED COURSE  As part of my  medical decision making, I reviewed the following data within the electronic MEDICAL RECORD NUMBER Nursing notes reviewed and incorporated, Old chart reviewed, Notes from prior ED visits, and [place] Controlled Substance Database       *Delorus Langwell was evaluated in Emergency Department on 08/28/2021 for the symptoms described in the history of present illness. She was evaluated in the context of the global COVID-19 pandemic, which necessitated consideration that the patient might be at risk for infection with the SARS-CoV-2 virus that causes COVID-19. Institutional protocols and algorithms that pertain to the evaluation of patients at risk for COVID-19 are in a state of rapid change based on information released by regulatory bodies including the CDC and federal and state organizations. These policies and algorithms were followed during the patient's care in the ED.  Some ED evaluations and interventions may be delayed as a result of limited staffing during the pandemic.*     Medical Decision Making:  20 year old female here with left flank pain.  Initial differential includes pyelonephritis, renal stone, less likely ovarian cyst.  CT stone study obtained, shows no acute abnormality.  No evidence of stone or hydronephrosis.  No evidence of ovarian cysts.  Lab work reviewed, is overall reassuring.  Normal white blood cell count.  Renal function at baseline.  UA shows pyuria and hematuria consistent with UTI with possible early pyonephritis clinically.  Lactic acid is normal.  No evidence of severe sepsis.  Patient given IV fluids, Rocephin, and Zofran with improvement in symptoms and she is tolerating p.o., otherwise well-appearing.  Will  discharge with outpatient treatment for possible early pyelo, good return precautions.  ____________________________________________  FINAL CLINICAL IMPRESSION(S) / ED DIAGNOSES  Final diagnoses:  Left flank pain  Acute cystitis with hematuria     MEDICATIONS GIVEN DURING THIS VISIT:  Medications  ondansetron (ZOFRAN) injection 4 mg (has no administration in time range)  lactated ringers bolus 1,000 mL (0 mLs Intravenous Stopped 08/28/21 0405)  cefTRIAXone (ROCEPHIN) 2 g in sodium chloride 0.9 % 100 mL IVPB (0 g Intravenous Stopped 08/28/21 0258)  morphine 4 MG/ML injection 4 mg (4 mg Intravenous Given 08/28/21 0153)  ketorolac (TORADOL) 30 MG/ML injection 15 mg (15 mg Intravenous Given 08/28/21 0153)  ondansetron (ZOFRAN) injection 4 mg (4 mg Intravenous Given 08/28/21 0153)     ED Discharge Orders          Ordered    cefdinir (OMNICEF) 300 MG capsule  2 times daily        08/28/21 0355    ondansetron (ZOFRAN ODT) 4 MG disintegrating tablet  Every 8 hours PRN        08/28/21 0355    HYDROcodone-acetaminophen (NORCO/VICODIN) 5-325 MG tablet  Every 6 hours PRN        08/28/21 0355    ibuprofen (ADVIL) 600 MG tablet  Every 8 hours PRN        08/28/21 0355             Note:  This document was prepared using Dragon voice recognition software and may include unintentional dictation errors.   Shaune Pollack, MD 08/28/21 [phone]

## 2021-08-28 NOTE — ED Notes (Signed)
At radiology for ct at this time

## 2021-08-28 NOTE — ED Notes (Signed)
Pt express having pain in the chest.

## 2021-08-28 NOTE — ED Notes (Signed)
Pt tolerated PO fluids well , consumed 1 cup water

## 2021-08-30 ENCOUNTER — Encounter (HOSPITAL_COMMUNITY): Payer: Self-pay | Admitting: Emergency Medicine

## 2021-08-30 ENCOUNTER — Ambulatory Visit (HOSPITAL_COMMUNITY)
Admission: EM | Admit: 2021-08-30 | Discharge: 2021-08-30 | Disposition: A | Discharge status: Home / Self Care | Payer: Medicaid Other | Attending: Medical Oncology | Admitting: Medical Oncology

## 2021-08-30 ENCOUNTER — Other Ambulatory Visit: Payer: Self-pay

## 2021-08-30 ENCOUNTER — Ambulatory Visit (HOSPITAL_COMMUNITY)
Admission: EM | Admit: 2021-08-30 | Discharge: 2021-08-30 | Discharge status: Against Medical Advice | Payer: Medicaid Other

## 2021-08-30 DIAGNOSIS — N3001 Acute cystitis with hematuria: Secondary | ICD-10-CM

## 2021-08-30 LAB — POCT URINALYSIS DIPSTICK, ED / UC
Bilirubin Urine: NEGATIVE
Glucose, UA: NEGATIVE mg/dL
Hgb urine dipstick: NEGATIVE
Ketones, ur: NEGATIVE mg/dL
Leukocytes,Ua: NEGATIVE
Nitrite: NEGATIVE
Protein, ur: NEGATIVE mg/dL
Specific Gravity, Urine: 1.015 (ref 1.005–1.030)
Urobilinogen, UA: 0.2 mg/dL (ref 0.0–1.0)
pH: 6 (ref 5.0–8.0)

## 2021-08-30 LAB — URINE CULTURE: Culture: 70000 — AB

## 2021-08-30 MED ORDER — SULFAMETHOXAZOLE-TRIMETHOPRIM 800-160 MG PO TABS
1.0000 | ORAL_TABLET | Freq: Two times a day (BID) | ORAL | 0 refills | Status: AC
Start: 2021-08-30 — End: 2021-09-06

## 2021-08-30 NOTE — ED Provider Notes (Signed)
[facility]    CSN: [id number] Arrival date & time: 08/30/21  1323      History   Chief Complaint Chief Complaint  Patient presents with   Back Pain    HPI Stefanie King is a 20 y.o. female.   HPI  Dysuria: Patient was seen earlier this week for UTI symptoms in the emergency room.  She was started on Omnicef however this caused her dizziness and abdominal pain so she stopped this medication. She has not tried anything else. Her urinary tract infection symptoms of dysuria, urinary frequency and low back pain have worsened. She has had some vomiting but this is improved on zofran. She denies fevers but has had chills. Reviewed her recent hCG, CBC, BMP, Urine culture, lactic acid, and blood culture results.   Past Medical History:  Diagnosis Date   Anxiety    Disordered eating 12/25/2015   Hypertension    Migraines    Pregnancy induced hypertension     Patient Active Problem List   Diagnosis Date Noted   Normal labor 04/29/2021   IUD (intrauterine device) in place 04/29/2021   Vaginal yeast infection 04/15/2021   History of gestational hypertension 10/28/2020   Supervision of other normal pregnancy, antepartum 10/21/2020   Chlamydia infection affecting pregnancy 10/07/2020   Severe recurrent major depression without psychotic features (HCC) 03/11/2018   Marijuana abuse 10/21/2017   Adjustment disorder with mixed anxiety and depressed mood 01/13/2016   Migraine without aura and without status migrainosus, not intractable 01/01/2016   Disordered eating 12/25/2015   GERD (gastroesophageal reflux disease) 09/17/2015   Acne vulgaris 08/06/2015    Past Surgical History:  Procedure Laterality Date   CHOLECYSTECTOMY N/A 10/17/2014   Procedure: LAPAROSCOPIC CHOLECYSTECTOMY WITHOUT INTRAOPERATIVE CHOLANGIOGRAM;  Surgeon: Judie Petit. Leonia Corona, MD;  Location: [facility] OR;  Service: Pediatrics;  Laterality: N/A;  laparoscopic cholecystectomy   CHOLECYSTECTOMY  2015   DENTAL  SURGERY      OB History     Gravida  2   Para  2   Term  2   Preterm      AB      Living  2      SAB      IAB      Ectopic      Multiple  0   Live Births  2            Home Medications    Prior to Admission medications   Medication Sig Start Date End Date Taking? Authorizing Provider  acetaminophen (TYLENOL) 325 MG tablet Take 2 tablets (650 mg total) by mouth every 4 (four) hours as needed (for pain scale < 4). 12/23/18   Kellogg, Alexandra L, DO  ASPIRIN 81 PO Take 81 mg by mouth daily. 04/28/21   [provider]  benzocaine-Menthol (DERMOPLAST) 20-0.5 % AERO Apply 1 application topically as needed for irritation (perineal discomfort). 04/30/21   Brand Males, CNM  cefdinir (OMNICEF) 300 MG capsule Take 1 capsule (300 mg total) by mouth 2 (two) times daily for 10 days. 08/28/21 09/07/21  Shaune Pollack, MD  HYDROcodone-acetaminophen (NORCO/VICODIN) 5-325 MG tablet Take 1 tablet by mouth every 6 (six) hours as needed for severe pain. 08/28/21 08/28/22  Shaune Pollack, MD  ibuprofen (ADVIL) 600 MG tablet Take 1 tablet (600 mg total) by mouth every 8 (eight) hours as needed for mild pain or moderate pain. 08/28/21   Shaune Pollack, MD  ondansetron (ZOFRAN ODT) 4 MG disintegrating tablet Take 1 tablet (  4 mg total) by mouth every 8 (eight) hours as needed for nausea or vomiting. 08/28/21   Shaune Pollack, MD  Prenatal Vit-Fe Fumarate-FA (PRENATAL VITAMIN) 27-0.8 MG TABS Take 1 tablet by mouth daily. 05/04/18   Verneda Skill, FNP  witch hazel-glycerin (TUCKS) pad Apply 1 application topically as needed for hemorrhoids. 04/30/21   Brand Males, CNM    Family History Family History  Problem Relation Age of Onset   Alcohol abuse Father    Hypertension Father    Cancer Paternal Grandmother    Asthma Neg Hx    Depression Neg Hx    Diabetes Neg Hx    Drug abuse Neg Hx    Early death Neg Hx    Hearing loss Neg Hx    Heart disease Neg Hx     Hyperlipidemia Neg Hx    Kidney disease Neg Hx    Mental illness Neg Hx    Mental retardation Neg Hx    Stroke Neg Hx     Social History Social History   Tobacco Use   Smoking status: Never   Smokeless tobacco: Never   Tobacco comments:    last marijuana smoked 06-25-18  Vaping Use   Vaping Use: Every day  Substance Use Topics   Alcohol use: No    Alcohol/week: 0.0 standard drinks   Drug use: Not Currently    Types: Marijuana    Comment: 01-2021     Allergies   Omnicef [cefdinir]   Review of Systems Review of Systems  As stated above in HPI Physical Exam Triage Vital Signs ED Triage Vitals  Enc Vitals Group     BP 08/30/21 1455 98/60     Pulse Rate 08/30/21 1455 61     Resp 08/30/21 1455 15     Temp 08/30/21 1455 97.8 F (36.6 C)     Temp Source 08/30/21 1455 Oral     SpO2 08/30/21 1455 100 %     Weight --      Height --      Head Circumference --      Peak Flow --      Pain Score 08/30/21 1453 6     Pain Loc --      Pain Edu? --      Excl. in GC? --    No data found.  Updated Vital Signs BP 98/60 (BP Location: Left Arm)   Pulse 61   Temp 97.8 F (36.6 C) (Oral)   Resp 15   SpO2 100%   Physical Exam Vitals and nursing note reviewed.  Constitutional:      General: She is not in acute distress.    Appearance: Normal appearance. She is not ill-appearing, toxic-appearing or diaphoretic.  HENT:     Head: Normocephalic and atraumatic.     Mouth/Throat:     Mouth: Mucous membranes are moist.  Cardiovascular:     Rate and Rhythm: Normal rate and regular rhythm.     Heart sounds: Normal heart sounds.  Pulmonary:     Effort: Pulmonary effort is normal.     Breath sounds: Normal breath sounds.  Abdominal:     General: Abdomen is flat. Bowel sounds are normal. There is no distension.     Palpations: Abdomen is soft. There is no mass.     Tenderness: There is no abdominal tenderness. There is no right CVA tenderness, left CVA tenderness, guarding or  rebound.     Hernia: No hernia is present.  Musculoskeletal:     Cervical back: Normal range of motion and neck supple.  Lymphadenopathy:     Cervical: No cervical adenopathy.  Skin:    General: Skin is warm.     Coloration: Skin is not jaundiced.  Neurological:     Mental Status: She is alert and oriented to person, place, and time.     UC Treatments / Results  Labs (all labs ordered are listed, but only abnormal results are displayed) Labs Reviewed  URINE CULTURE  POCT URINALYSIS DIPSTICK, ED / UC    EKG   Radiology No results found.  Procedures Procedures (including critical care time)  Medications Ordered in UC Medications - No data to display  Initial Impression / Assessment and Plan / UC Course  I have reviewed the triage vital signs and the nursing notes.  Pertinent labs & imaging results that were available during my care of the patient were reviewed by me and considered in my medical decision making (see chart for details).     New.  She will need to be treated with a different antibiotic to help with her urinary tract infection.  Culture pending.  Elected to treat her with Bactrim. Rest and hydration with water encouraged.   Final Clinical Impressions(s) / UC Diagnoses   Final diagnoses:  None   Discharge Instructions   None    ED Prescriptions   None    PDMP not reviewed this encounter.   Rushie Chestnut, [place] 08/30/21 1610

## 2021-08-30 NOTE — ED Triage Notes (Signed)
Pt c/o left back/flank pain for over week. Reports was seen in ED for possible kidney stone but had infection. Reports antibiotics that prescribed is allergic to and was informed to stop taking. Pt reports that not feeling better and concern for sepsis but ED waits are to long.

## 2021-08-31 LAB — URINE CULTURE: Culture: NO GROWTH

## 2021-08-31 NOTE — Progress Notes (Signed)
ED Antimicrobial Stewardship Positive Culture Follow Up   Stefanie King is an 20 y.o. female who presented to [facility] on 08/30/2021 with a chief complaint of  Chief Complaint  Patient presents with   Back Pain    Recent Results (from the past 720 hour(s))  Urine Culture     Status: Abnormal   Collection Time: 08/27/21 10:15 PM   Specimen: Urine, Random  Result Value Ref Range Status   Specimen Description   Final    URINE, RANDOM Performed at [facility], [address], [place], [place] [address]    Special Requests   Final    NONE Performed at [facility], [address] [place], [place] [address]    Culture 70,000 COLONIES/mL STAPHYLOCOCCUS SAPROPHYTICUS (A)  Final   Report Status 08/30/2021 FINAL  Final   Organism ID, Bacteria STAPHYLOCOCCUS SAPROPHYTICUS (A)  Final      Susceptibility   Staphylococcus saprophyticus - MIC*    CIPROFLOXACIN <=0.5 SENSITIVE Sensitive     GENTAMICIN <=0.5 SENSITIVE Sensitive     NITROFURANTOIN <=16 SENSITIVE Sensitive     OXACILLIN 2 RESISTANT Resistant     TETRACYCLINE <=1 SENSITIVE Sensitive     VANCOMYCIN <=0.5 SENSITIVE Sensitive     TRIMETH/SULFA <=10 SENSITIVE Sensitive     CLINDAMYCIN <=0.25 SENSITIVE Sensitive     RIFAMPIN <=0.5 SENSITIVE Sensitive     Inducible Clindamycin NEGATIVE Sensitive     * 70,000 COLONIES/mL STAPHYLOCOCCUS SAPROPHYTICUS  Blood culture (single)     Status: None (Preliminary result)   Collection Time: 08/28/21  1:44 AM   Specimen: BLOOD  Result Value Ref Range Status   Specimen Description BLOOD RIGHT ASSIST CONTROL  Final   Special Requests   Final    BOTTLES DRAWN AEROBIC AND ANAEROBIC Blood Culture results may not be optimal due to an inadequate volume of blood received in culture bottles   Culture   Final    NO GROWTH 3 DAYS Performed at [facility], [address] [place], [place] [address]    Report Status PENDING  Incomplete    [x]  Initially Treated with  cefdinir 08/27/21 but pt could not tolerate and went to urgent care 08/30/21 and new abx prescribed: Bactrim per notes. This should cover above organism    Apphia Cropley A 08/31/2021, 1:31 PM Clinical Pharmacist

## 2021-09-02 LAB — CULTURE, BLOOD (SINGLE): Culture: NO GROWTH

## 2021-09-04 ENCOUNTER — Encounter: Payer: Self-pay | Admitting: Family Medicine

## 2021-09-04 ENCOUNTER — Ambulatory Visit: Payer: Medicaid Other | Admitting: Family Medicine

## 2021-09-04 ENCOUNTER — Other Ambulatory Visit: Payer: Self-pay

## 2021-09-04 VITALS — BP 98/66 | HR 91 | Temp 98.0°F | Ht 63.0 in | Wt 118.0 lb

## 2021-09-04 DIAGNOSIS — N3000 Acute cystitis without hematuria: Secondary | ICD-10-CM | POA: Diagnosis not present

## 2021-09-04 DIAGNOSIS — R1084 Generalized abdominal pain: Secondary | ICD-10-CM

## 2021-09-04 LAB — URINALYSIS, ROUTINE W REFLEX MICROSCOPIC
Bilirubin, UA: NEGATIVE
Glucose, UA: NEGATIVE
Leukocytes,UA: NEGATIVE
Nitrite, UA: NEGATIVE
RBC, UA: NEGATIVE
Specific Gravity, UA: >1.03 — ABNORMAL HIGH (ref 1.005–1.030)
Urobilinogen, Ur: 1 mg/dL (ref 0.2–1.0)
pH, UA: 5.5 (ref 5.0–7.5)

## 2021-09-04 LAB — CBC WITH DIFFERENTIAL/PLATELET
Basophils Absolute: 0.1 10*3/uL (ref 0.0–0.2)
Basos: 2 %
EOS (ABSOLUTE): 0.1 10*3/uL (ref 0.0–0.4)
Eos: 2 %
Hematocrit: 41 % (ref 34.0–46.6)
Hemoglobin: 14 g/dL (ref 11.1–15.9)
Immature Grans (Abs): 0 10*3/uL (ref 0.0–0.1)
Immature Granulocytes: 0 %
Lymphocytes Absolute: 1.9 10*3/uL (ref 0.7–3.1)
Lymphs: 35 %
MCH: 29.9 pg (ref 26.6–33.0)
MCHC: 34.1 g/dL (ref 31.5–35.7)
MCV: 88 fL (ref 79–97)
Monocytes Absolute: 0.4 10*3/uL (ref 0.1–0.9)
Monocytes: 8 %
Neutrophils Absolute: 2.8 10*3/uL (ref 1.4–7.0)
Neutrophils: 53 %
Platelets: 357 10*3/uL (ref 150–450)
RBC: 4.68 x10E6/uL (ref 3.77–5.28)
RDW: 14.5 % (ref 11.7–15.4)
WBC: 5.3 10*3/uL (ref 3.4–10.8)

## 2021-09-04 LAB — MICROSCOPIC EXAMINATION: Renal Epithel, UA: NONE SEEN /hpf

## 2021-09-04 LAB — CMP14+EGFR
ALT: 39 IU/L — ABNORMAL HIGH (ref 0–32)
AST: 16 IU/L (ref 0–40)
Albumin/Globulin Ratio: 2 (ref 1.2–2.2)
Albumin: 4.9 g/dL (ref 3.9–5.0)
Alkaline Phosphatase: 100 IU/L (ref 44–121)
BUN/Creatinine Ratio: 12 (ref 9–23)
BUN: 11 mg/dL (ref 6–20)
Bilirubin Total: 0.3 mg/dL (ref 0.0–1.2)
CO2: 20 mmol/L (ref 20–29)
Calcium: 9.5 mg/dL (ref 8.7–10.2)
Chloride: 106 mmol/L (ref 96–106)
Creatinine, Ser: 0.9 mg/dL (ref 0.57–1.00)
Globulin, Total: 2.4 g/dL (ref 1.5–4.5)
Glucose: 86 mg/dL (ref 70–99)
Potassium: 4.6 mmol/L (ref 3.5–5.2)
Sodium: 141 mmol/L (ref 134–144)
Total Protein: 7.3 g/dL (ref 6.0–8.5)
eGFR: 93 mL/min/{1.73_m2} (ref >59)

## 2021-09-04 NOTE — Progress Notes (Signed)
Subjective:  Patient ID: Stefanie King, female    DOB: November 05, 2001, 20 y.o.   MRN: [id number]  Patient Care Team: Loman Brooklyn, FNP as PCP - General (Family Medicine)   Chief Complaint:  New Patient (Initial Visit) (UTI)   HPI: Stefanie King is a 20 y.o. female presenting on 09/04/2021 for New Patient (Initial Visit) (UTI)   Patient presents today to establish care with new primary care provider and for evaluation of ongoing urinary tract infection symptoms.  She was seen in the ED on 08/28/2021 for left flank pain.  She was diagnosed with UTI and started on Omnicef.  The Omnicef caused abdominal pain with nausea so she went to the urgent care on 08/30/2021 and Omnicef was changed to Bactrim.  She reports she has continued lower abdominal pressure.  She denies dysuria, hematuria, frequency, or hesitancy.  She does have urgency at times.  No fever, chills, weakness, confusion, or flank pain.  She does continue to have some nausea which she is taking Zofran for with relief of symptoms.  During hospital visit calcium was noted to be low.  [name] not on any vitamin repletion therapy.   Relevant past medical, surgical, family, and social history reviewed and updated as indicated.  Allergies and medications reviewed and updated. Data reviewed: Chart in Epic.   Past Medical History:  Diagnosis Date   Anxiety    Disordered eating 12/25/2015   Hypertension    Migraines    Pregnancy induced hypertension     Past Surgical History:  Procedure Laterality Date   CHOLECYSTECTOMY N/A 10/17/2014   Procedure: LAPAROSCOPIC CHOLECYSTECTOMY WITHOUT INTRAOPERATIVE CHOLANGIOGRAM;  Surgeon: Jerilynn Mages. Gerald Stabs, MD;  Location: [facility];  Service: Pediatrics;  Laterality: N/A;  laparoscopic cholecystectomy   CHOLECYSTECTOMY  2015   DENTAL SURGERY      Social History   Socioeconomic History   Marital status: Single    Spouse name: Not on file   Number of children: Not on file   Years of  education: Not on file   Highest education level: Not on file  Occupational History   Occupation: Unemployed  Tobacco Use   Smoking status: Never   Smokeless tobacco: Never   Tobacco comments:    last marijuana smoked 06-25-18  Vaping Use   Vaping Use: Every day  Substance and Sexual Activity   Alcohol use: No    Alcohol/week: 0.0 standard drinks   Drug use: Not Currently    Types: Marijuana    Comment: 01-2021   Sexual activity: Yes    Partners: Male    Birth control/protection: None  Other Topics Concern   Not on file  Social History Narrative   Stefanie King is a 11th grade student.   She attends [employer].    She lives with her mother, her brother, and her 2 sisters.    She enjoys drawing, yoga, and music.   Social Determinants of Health   Financial Resource Strain: Not on file  Food Insecurity: Not on file  Transportation Needs: Not on file  Physical Activity: Not on file  Stress: Not on file  Social Connections: Not on file  Intimate Partner Violence: Not on file    Outpatient Encounter Medications as of 09/04/2021  Medication Sig   acetaminophen (TYLENOL) 325 MG tablet Take 2 tablets (650 mg total) by mouth every 4 (four) hours as needed (for pain scale < 4).   ASPIRIN 81 PO Take 81 mg by mouth daily.  HYDROcodone-acetaminophen (NORCO/VICODIN) 5-325 MG tablet Take 1 tablet by mouth every 6 (six) hours as needed for severe pain.   ibuprofen (ADVIL) 600 MG tablet Take 1 tablet (600 mg total) by mouth every 8 (eight) hours as needed for mild pain or moderate pain.   ondansetron (ZOFRAN ODT) 4 MG disintegrating tablet Take 1 tablet (4 mg total) by mouth every 8 (eight) hours as needed for nausea or vomiting.   sulfamethoxazole-trimethoprim (BACTRIM DS) 800-160 MG tablet Take 1 tablet by mouth 2 (two) times daily for 7 days.   [DISCONTINUED] benzocaine-Menthol (DERMOPLAST) 20-0.5 % AERO Apply 1 application topically as needed for irritation (perineal discomfort).    [DISCONTINUED] cefdinir (OMNICEF) 300 MG capsule Take 1 capsule (300 mg total) by mouth 2 (two) times daily for 10 days.   [DISCONTINUED] Prenatal Vit-Fe Fumarate-FA (PRENATAL VITAMIN) 27-0.8 MG TABS Take 1 tablet by mouth daily.   [DISCONTINUED] witch hazel-glycerin (TUCKS) pad Apply 1 application topically as needed for hemorrhoids.   pantoprazole (PROTONIX) 20 MG tablet Take 20 mg by mouth daily.   No facility-administered encounter medications on file as of 09/04/2021.    Allergies  Allergen Reactions   Omnicef [Cefdinir] Other (See Comments)    Dizzy, abd pains    Review of Systems  Constitutional:  Negative for activity change, appetite change, chills, diaphoresis, fatigue, fever and unexpected weight change.  Gastrointestinal:  Positive for abdominal pain and nausea. Negative for abdominal distention, anal bleeding, blood in stool, constipation, diarrhea, rectal pain and vomiting.  Genitourinary:  Positive for urgency. Negative for decreased urine volume, difficulty urinating, dyspareunia, dysuria, enuresis, flank pain, frequency, genital sores, hematuria, menstrual problem, pelvic pain, vaginal bleeding, vaginal discharge and vaginal pain.  Neurological:  Negative for weakness.  Psychiatric/Behavioral:  Negative for confusion.   All other systems reviewed and are negative.      Objective:  BP 98/66   Pulse 91   Temp 98 F (36.7 C)   Ht 5' 3" (1.6 m)   Wt 118 lb (53.5 kg)   LMP 08/27/2021 (Approximate)   SpO2 97%   Breastfeeding No   BMI 20.90 kg/m    Wt Readings from Last 3 Encounters:  09/04/21 118 lb (53.5 kg)  08/27/21 120 lb (54.4 kg)  07/22/21 128 lb (58.1 kg)    Physical Exam Vitals and nursing note reviewed.  Constitutional:      General: She is not in acute distress.    Appearance: Normal appearance. She is well-developed, well-groomed and normal weight. She is not ill-appearing, toxic-appearing or diaphoretic.  HENT:     Head: Normocephalic and  atraumatic.     Jaw: There is normal jaw occlusion.     Right Ear: Hearing normal.     Left Ear: Hearing normal.     Nose: Nose normal.     Mouth/Throat:     Lips: Pink.     Mouth: Mucous membranes are moist.     Pharynx: Oropharynx is clear. Uvula midline.  Eyes:     General: Lids are normal.     Extraocular Movements: Extraocular movements intact.     Conjunctiva/sclera: Conjunctivae normal.     Pupils: Pupils are equal, round, and reactive to light.  Neck:     Thyroid: No thyroid mass, thyromegaly or thyroid tenderness.     Vascular: No carotid bruit or JVD.     Trachea: Trachea and phonation normal.  Cardiovascular:     Rate and Rhythm: Normal rate and regular rhythm.     Chest  Wall: PMI is not displaced.     Pulses: Normal pulses.     Heart sounds: Normal heart sounds. No murmur heard.   No friction rub. No gallop.  Pulmonary:     Effort: Pulmonary effort is normal. No respiratory distress.     Breath sounds: Normal breath sounds. No wheezing.  Abdominal:     General: Abdomen is flat. Bowel sounds are normal. There is no distension or abdominal bruit.     Palpations: Abdomen is soft. There is no hepatomegaly, splenomegaly or mass.     Tenderness: There is no abdominal tenderness. There is no right CVA tenderness, left CVA tenderness, guarding or rebound.     Hernia: No hernia is present.  Musculoskeletal:        General: Normal range of motion.     Cervical back: Normal range of motion and neck supple.     Right lower leg: No edema.     Left lower leg: No edema.  Lymphadenopathy:     Cervical: No cervical adenopathy.  Skin:    General: Skin is warm and dry.     Capillary Refill: Capillary refill takes less than 2 seconds.     Coloration: Skin is not cyanotic, jaundiced or pale.     Findings: No rash.  Neurological:     General: No focal deficit present.     Mental Status: She is alert and oriented to person, place, and time.     Sensory: Sensation is intact.      Motor: Motor function is intact.     Coordination: Coordination is intact.     Gait: Gait is intact.     Deep Tendon Reflexes: Reflexes are normal and symmetric.  Psychiatric:        Attention and Perception: Attention and perception normal.        Mood and Affect: Mood and affect normal.        Speech: Speech normal.        Behavior: Behavior normal. Behavior is cooperative.        Thought Content: Thought content normal.        Cognition and Memory: Cognition and memory normal.        Judgment: Judgment normal.    Results for orders placed or performed during the hospital encounter of 08/30/21  Urine Culture   Specimen: Urine, Clean Catch  Result Value Ref Range   Specimen Description URINE, CLEAN CATCH    Special Requests NONE    Culture      NO GROWTH Performed at [facility], [place] [address], [place], [place] [address]    Report Status 08/31/2021 FINAL   POC Urinalysis dipstick  Result Value Ref Range   Glucose, UA NEGATIVE NEGATIVE mg/dL   Bilirubin Urine NEGATIVE NEGATIVE   Ketones, ur NEGATIVE NEGATIVE mg/dL   Specific Gravity, Urine 1.015 1.005 - 1.030   Hgb urine dipstick NEGATIVE NEGATIVE   pH 6.0 5.0 - 8.0   Protein, ur NEGATIVE NEGATIVE mg/dL   Urobilinogen, UA 0.2 0.0 - 1.0 mg/dL   Nitrite NEGATIVE NEGATIVE   Leukocytes,Ua NEGATIVE NEGATIVE       Pertinent labs & imaging results that were available during my care of the patient were reviewed by me and considered in my medical decision making.  Assessment & Plan:  Shantel was seen today for new patient (initial visit).  Diagnoses and all orders for this visit:  Generalized abdominal pain Recent UTI Will check CBC and CMP today.  Repeat urinalysis and culture.  We will treat if warranted.  Symptomatic care discussed in detail.  Increase fluid intake and avoid bladder irritants.  Report any new, worsening, or persistent symptoms.  Follow-up in 4 weeks for reevaluation. -     CBC with  Differential/Platelet -     CMP14+EGFR -     Urinalysis, Routine w reflex microscopic -     Urine Culture  Hypocalcemia We will recheck CMP today. -     CMP14+EGFR     Continue all other maintenance medications.  Follow up plan: Return in about 4 weeks (around 10/02/2021), or if symptoms worsen or fail to improve.   Continue healthy lifestyle choices, including diet (rich in fruits, vegetables, and lean proteins, and low in salt and simple carbohydrates) and exercise (at least 30 minutes of moderate physical activity daily).  Educational handout given for abdominal pain  The above assessment and management plan was discussed with the patient. The patient verbalized understanding of and has agreed to the management plan. [name] aware to call the clinic if they develop any new symptoms or if symptoms persist or worsen. [name] aware when to return to the clinic for a follow-up visit. Patient educated on when it is appropriate to go to the emergency department.   Monia Pouch, FNP-[facility] [phone]

## 2021-09-07 LAB — URINE CULTURE

## 2021-11-21 IMAGING — US US MFM OB COMP +14 WKS
1 series · 14 of 28 positions shown · non-contrast
Comparison: none

[Series 1: us mfm ob comp +14 wks · 85 acquisitions, 14 frames shown]
[im 4/85]
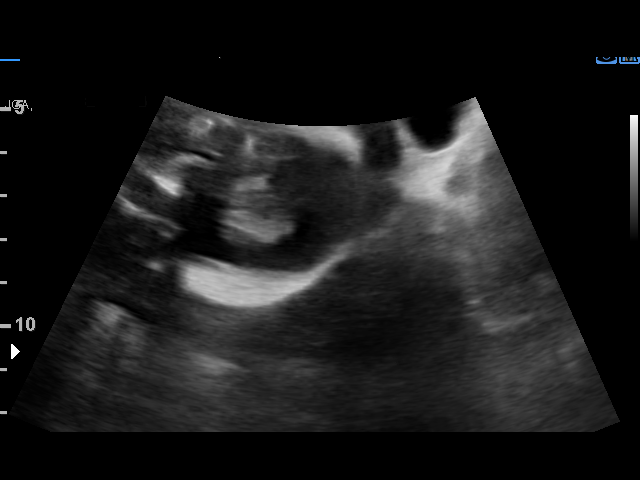
[im 10/85]
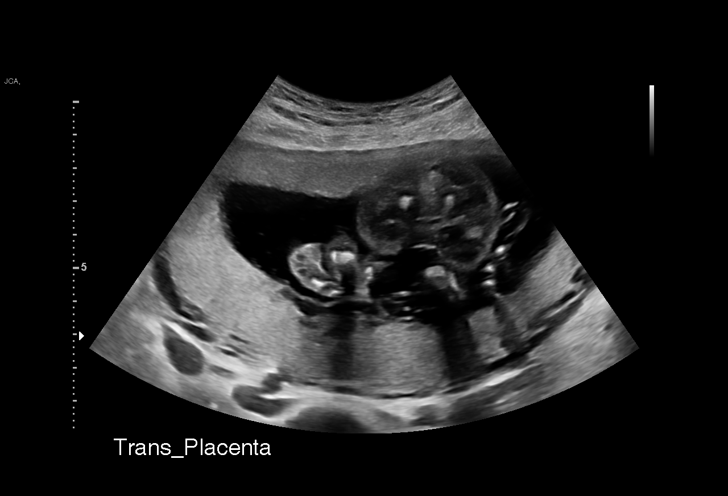
[im 16/85]
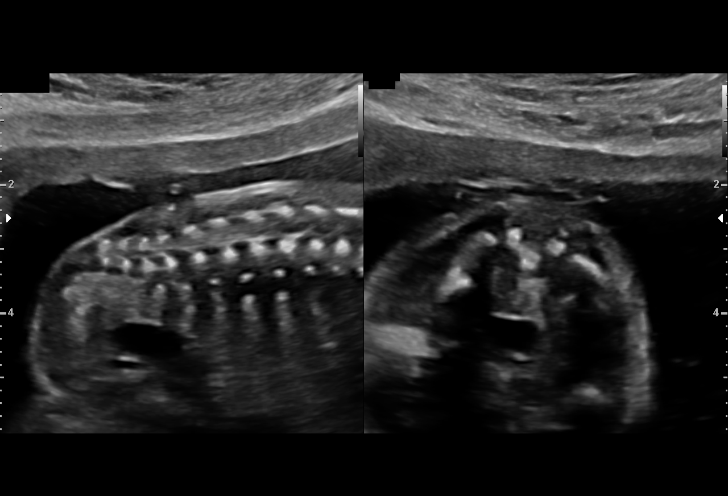
[im 22/85]
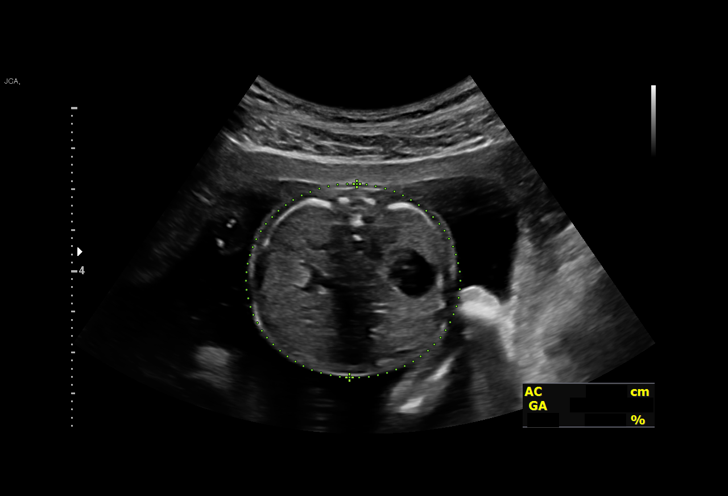
[im 29/85]
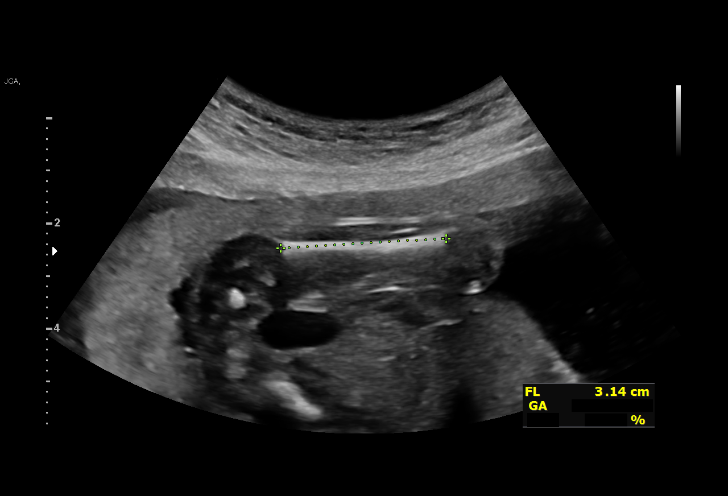
[im 35/85]
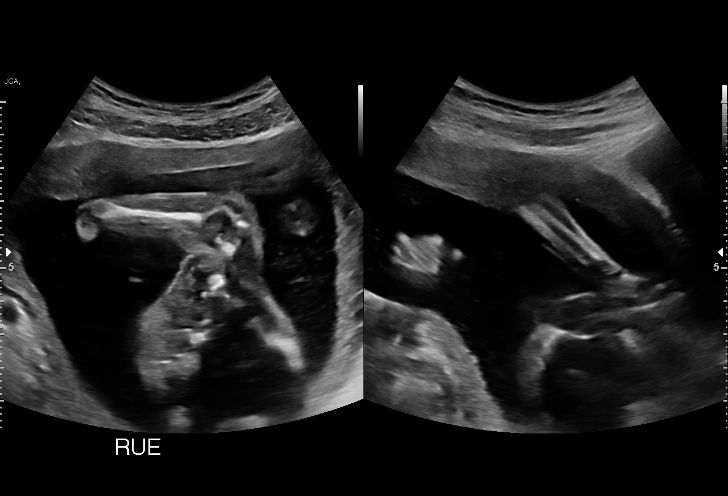
[im 41/85]
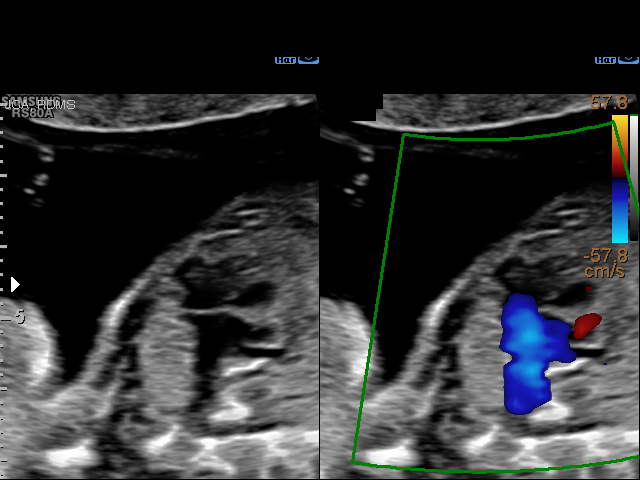
[im 47/85]
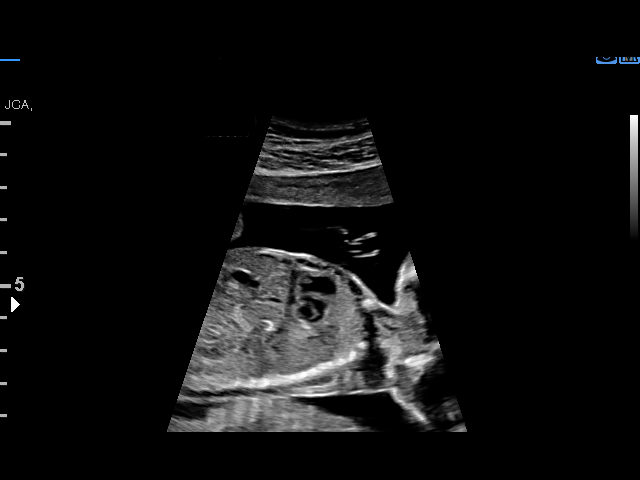
[im 53/85]
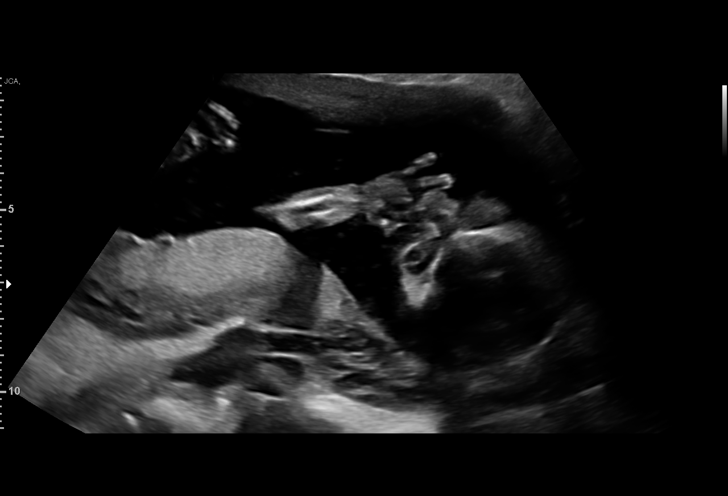
[im 60/85]
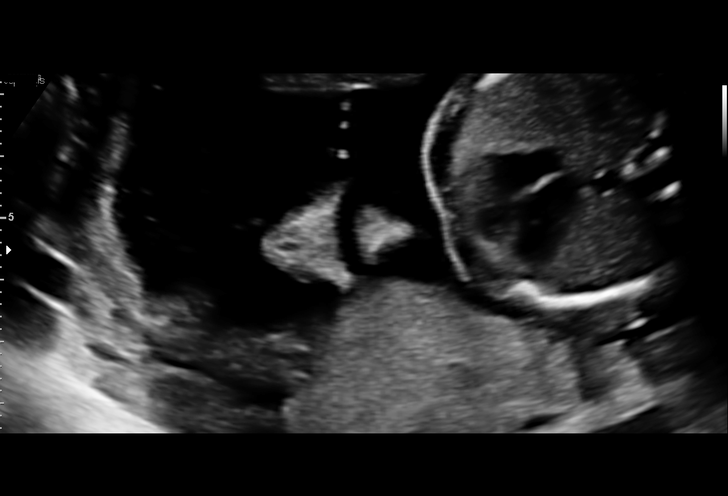
[im 66/85]
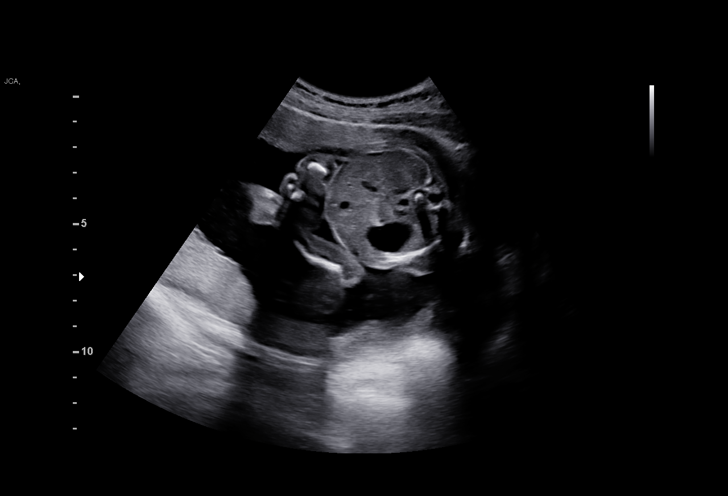
[im 72/85]
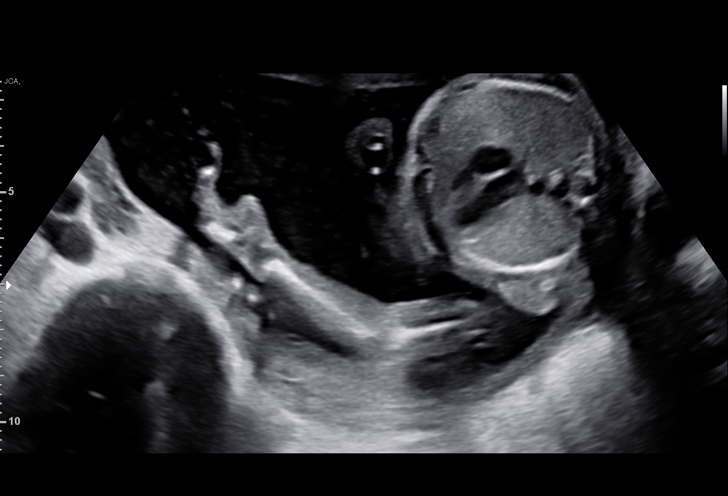
[im 78/85]
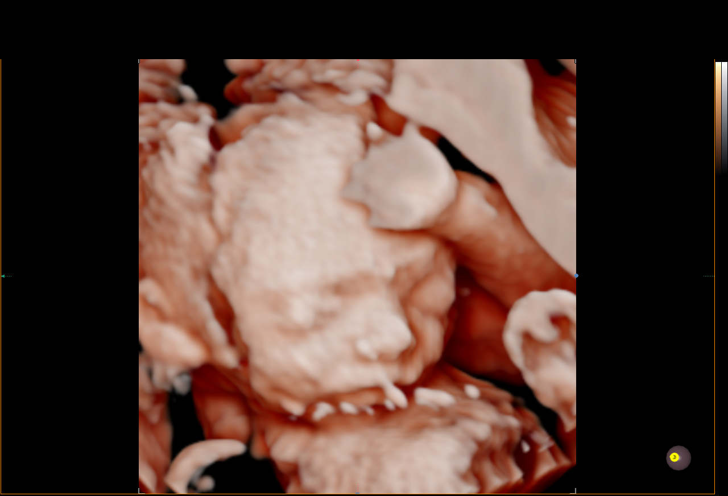
[im 85/85]
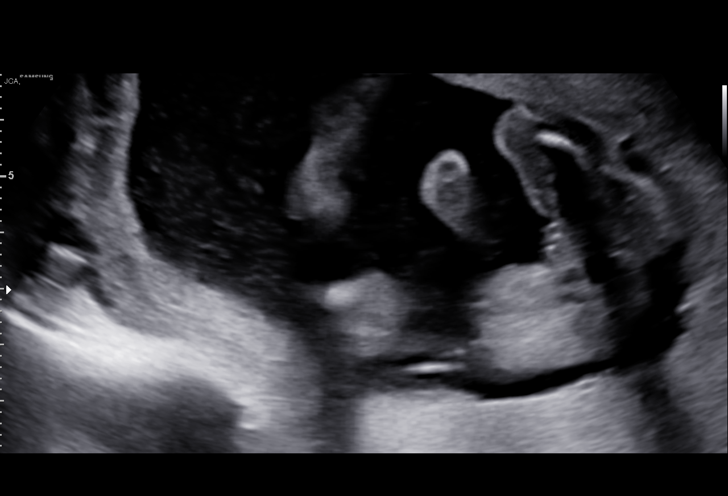

[14 of 28 positions shown; findings below may reference images not displayed]

Indications

 Fetal abnormality - other known or suspected
 19 weeks gestation of pregnancy
 Encounter for antenatal screening for
 malformations
Fetal Evaluation

 Num Of Fetuses:         1
 Fetal Heart Rate(bpm):  147
 Cardiac Activity:       Observed
 Presentation:           Cephalic
 Placenta:               Posterior Fundal
 P. Cord Insertion:      Visualized

 Amniotic Fluid
 AFI FV:      Within normal limits

                             Largest Pocket(cm)

Biometry

 BPD:        49  mm     G. Age:  20w 6d         85  %    CI:         73.5   %    70 - 86
                                                         FL/HC:      17.3   %    16.8 -
 HC:      181.6  mm     G. Age:  20w 4d         74  %    HC/AC:      1.12        1.09 -
 AC:      161.5  mm     G. Age:  21w 2d         85  %    FL/BPD:     64.3   %
 FL:       31.5  mm     G. Age:  19w 6d         40  %    FL/AC:      19.5   %    20 - 24
 HUM:      31.4  mm     G. Age:  20w 3d         69  %
 CER:      20.7  mm     G. Age:  19w 6d         67  %
 NFT:       6.0  mm

 LV:          6  mm
 CM:        5.9  mm

 Est. FW:     364  gm    0 lb 13 oz      85  %
OB History

 Gravidity:    2         Term:   1
 Living:       1
Gestational Age

 LMP:           19w 6d        Date:  08/06/20                 EDD:   05/13/21
 U/S Today:     20w 5d                                        EDD:   05/07/21
 Best:          19w 6d     Det. By:  LMP  (08/06/20)          EDD:   05/13/21
Anatomy

 Cranium:               Appears normal         Aortic Arch:            Appears normal
 Cavum:                 Appears normal         Ductal Arch:            Appears normal
 Ventricles:            Appears normal         Diaphragm:              Previously seen
 Choroid Plexus:        Appears normal         Stomach:                Appears normal, left
                                                                       sided
 Cerebellum:            Appears normal         Abdomen:                Appears normal
 Posterior Fossa:       Appears normal         Abdominal Wall:         Appears nml (cord
                                                                       insert, abd wall)
 Nuchal Fold:           Appears normal         Cord Vessels:           Appears normal (3
                                                                       vessel cord)
 Face:                  Appears normal         Kidneys:                Appear normal
                        (orbits and profile)
 Lips:                  Appears normal         Bladder:                Appears normal
 Thoracic:              Appears normal         Spine:                  Limited views
                                                                       appear normal
 Heart:                 Appears normal         Upper Extremities:      Appears normal
                        (4CH, axis, and
                        situs)
 RVOT:                  Appears normal         Lower Extremities:      Appears normal
 LVOT:                  Appears normal

 Other:  Fetus appears to be female. Nasal bone visualized. Technically
         difficult due to fetal position.
Comments

 This patient was seen for a detailed fetal anatomy scan.
 She denies any significant past medical history and denies
 any problems in her current pregnancy.
 She had a cell free DNA test earlier in her pregnancy which
 indicated a low risk for trisomy 21, 18, and 13. A female fetus
 is predicted.
 She was informed that the fetal growth and amniotic fluid
 level were appropriate for her gestational age.
 There were no obvious fetal anomalies noted on today's
 ultrasound exam.
 The patient was informed that anomalies may be missed due
 to technical limitations. If the fetus is in a suboptimal position
 or maternal habitus is increased, visualization of the fetus in
 the maternal uterus may be impaired.
 Follow up as indicated.

## 2022-02-27 ENCOUNTER — Emergency Department (HOSPITAL_COMMUNITY): Payer: Medicaid Other

## 2022-02-27 ENCOUNTER — Encounter (HOSPITAL_COMMUNITY): Payer: Self-pay | Admitting: Pharmacy Technician

## 2022-02-27 ENCOUNTER — Emergency Department (HOSPITAL_COMMUNITY)
Admission: EM | Admit: 2022-02-27 | Discharge: 2022-02-27 | Disposition: A | Discharge status: Home / Self Care | Payer: Medicaid Other | Attending: Emergency Medicine | Admitting: Emergency Medicine

## 2022-02-27 ENCOUNTER — Other Ambulatory Visit: Payer: Self-pay

## 2022-02-27 DIAGNOSIS — Z7982 Long term (current) use of aspirin: Secondary | ICD-10-CM | POA: Diagnosis not present

## 2022-02-27 DIAGNOSIS — R0781 Pleurodynia: Secondary | ICD-10-CM

## 2022-02-27 DIAGNOSIS — R079 Chest pain, unspecified: Secondary | ICD-10-CM | POA: Diagnosis not present

## 2022-02-27 DIAGNOSIS — R1011 Right upper quadrant pain: Secondary | ICD-10-CM | POA: Insufficient documentation

## 2022-02-27 DIAGNOSIS — R109 Unspecified abdominal pain: Secondary | ICD-10-CM | POA: Diagnosis present

## 2022-02-27 DIAGNOSIS — D72829 Elevated white blood cell count, unspecified: Secondary | ICD-10-CM | POA: Insufficient documentation

## 2022-02-27 LAB — COMPREHENSIVE METABOLIC PANEL
ALT: 15 U/L (ref 0–44)
AST: 15 U/L (ref 15–41)
Albumin: 3.5 g/dL (ref 3.5–5.0)
Alkaline Phosphatase: 56 U/L (ref 38–126)
Anion gap: 6 (ref 5–15)
BUN: 5 mg/dL — ABNORMAL LOW (ref 6–20)
CO2: 25 mmol/L (ref 22–32)
Calcium: 8.9 mg/dL (ref 8.9–10.3)
Chloride: 107 mmol/L (ref 98–111)
Creatinine, Ser: 0.63 mg/dL (ref 0.44–1.00)
GFR, Estimated: >60 mL/min (ref >60)
Glucose, Bld: 81 mg/dL (ref 70–99)
Potassium: 4 mmol/L (ref 3.5–5.1)
Sodium: 138 mmol/L (ref 135–145)
Total Bilirubin: 0.3 mg/dL (ref 0.3–1.2)
Total Protein: 7.1 g/dL (ref 6.5–8.1)

## 2022-02-27 LAB — URINALYSIS, ROUTINE W REFLEX MICROSCOPIC
Bilirubin Urine: NEGATIVE
Glucose, UA: NEGATIVE mg/dL
Hgb urine dipstick: NEGATIVE
Ketones, ur: NEGATIVE mg/dL
Leukocytes,Ua: NEGATIVE
Nitrite: NEGATIVE
Protein, ur: NEGATIVE mg/dL
Specific Gravity, Urine: 1.015 (ref 1.005–1.030)
pH: 8 (ref 5.0–8.0)

## 2022-02-27 LAB — LIPASE, BLOOD: Lipase: 32 U/L (ref 11–51)

## 2022-02-27 LAB — CBC
HCT: 39.7 % (ref 36.0–46.0)
Hemoglobin: 13 g/dL (ref 12.0–15.0)
MCH: 30.1 pg (ref 26.0–34.0)
MCHC: 32.7 g/dL (ref 30.0–36.0)
MCV: 91.9 fL (ref 80.0–100.0)
Platelets: 383 10*3/uL (ref 150–400)
RBC: 4.32 MIL/uL (ref 3.87–5.11)
RDW: 13.3 % (ref 11.5–15.5)
WBC: 11.2 10*3/uL — ABNORMAL HIGH (ref 4.0–10.5)
nRBC: 0 % (ref 0.0–0.2)

## 2022-02-27 LAB — I-STAT BETA HCG BLOOD, ED (MC, WL, AP ONLY): I-stat hCG, quantitative: <5 m[IU]/mL (ref <5)

## 2022-02-27 MED ORDER — DOXYCYCLINE HYCLATE 100 MG PO CAPS: 100.0000 mg | ORAL_CAPSULE | Freq: Two times a day (BID) | ORAL | 0 refills | Status: AC

## 2022-02-27 NOTE — Discharge Instructions (Signed)
Please see your primary care provider in 1 to 2 days.  Continue to take antibiotic.  You will need to be evaluated a few days.  Please come back to the emergency department for worsening symptoms including fever or uncontrolled pain. ?

## 2022-02-27 NOTE — ED Provider Notes (Signed)
?[facility] EMERGENCY DEPARTMENT ?Provider Note ? ? ?CSN: [id number] ?Arrival date & time: 02/27/22  1228 ? ?  ? ?History ? ?Chief Complaint  ?Patient presents with  ? Abdominal Pain  ? ? ?Stefanie King is a 21 y.o. female. ? ? ?Abdominal Pain ? ?[name] a 21 year old female with past medical history of right kidney stone and cholecystectomy who presents to the emergency department for acute onset right flank pain associated with 2 days of cough.  She describes the flank pain as sharp and worse with taking deep breath.  Patient denies associated urinary symptoms.  Denies fever.  Denies chest pain or shortness of breath.  She does report similar symptoms in the past when she had her kidney stone on the right.  Denies associated nausea or vomiting.  Denies diarrhea.  Denies sick contact.  Denies prolonged distance travel or leg swelling.  Denies any trauma to the right flank.  Otherwise no other complaints. ? ?Home Medications ?Prior to Admission medications   ?Medication Sig Start Date End Date Taking? Authorizing Provider  ?doxycycline (VIBRAMYCIN) 100 MG capsule Take 1 capsule (100 mg total) by mouth 2 (two) times daily for 5 days. 02/27/22 03/04/22 Yes Jari Sportsman, MD  ?acetaminophen (TYLENOL) 325 MG tablet Take 2 tablets (650 mg total) by mouth every 4 (four) hours as needed (for pain scale < 4). 12/23/18   Kellogg, Alexandra L, DO  ?ASPIRIN 81 PO Take 81 mg by mouth daily. 04/28/21   [provider]  ?HYDROcodone-acetaminophen (NORCO/VICODIN) 5-325 MG tablet Take 1 tablet by mouth every 6 (six) hours as needed for severe pain. 08/28/21 08/28/22  Shaune Pollack, MD  ?ibuprofen (ADVIL) 600 MG tablet Take 1 tablet (600 mg total) by mouth every 8 (eight) hours as needed for mild pain or moderate pain. 08/28/21   Shaune Pollack, MD  ?ondansetron (ZOFRAN ODT) 4 MG disintegrating tablet Take 1 tablet (4 mg total) by mouth every 8 (eight) hours as needed for nausea or vomiting. 08/28/21    Shaune Pollack, MD  ?pantoprazole (PROTONIX) 20 MG tablet Take 20 mg by mouth daily. 05/28/21   [provider]  ?   ? ?Allergies    ?Omnicef [cefdinir]   ? ?Review of Systems   ?Review of Systems  ?Gastrointestinal:  Positive for abdominal pain.  ? ?Physical Exam ?Updated Vital Signs ?BP 117/87   Pulse 80   Temp 98.9 ?F (37.2 ?C) (Oral)   Resp 16   SpO2 98%  ?Physical Exam ?Constitutional:   ?   General: She is not in acute distress. ?   Appearance: She is not ill-appearing.  ?HENT:  ?   Head: Normocephalic.  ?Eyes:  ?   Extraocular Movements: Extraocular movements intact.  ?Cardiovascular:  ?   Rate and Rhythm: Normal rate.  ?   Heart sounds: Normal heart sounds.  ?Pulmonary:  ?   Effort: Pulmonary effort is normal. No respiratory distress.  ?   Breath sounds: Rhonchi present. No wheezing.  ?   Comments: Rhonchi on the right lower lung field. ?Abdominal:  ?   General: Bowel sounds are normal. There is no distension.  ?   Tenderness: There is no abdominal tenderness. There is no right CVA tenderness, left CVA tenderness, guarding or rebound.  ?Skin: ?   General: Skin is warm.  ?   Capillary Refill: Capillary refill takes less than 2 seconds.  ?Neurological:  ?   General: No focal deficit present.  ?   Mental  Status: She is alert. She is disoriented.  ? ? ?ED Results / Procedures / Treatments   ?Labs ?(all labs ordered are listed, but only abnormal results are displayed) ?Labs Reviewed  ?COMPREHENSIVE METABOLIC PANEL - Abnormal; Notable for the following components:  ?    Result Value  ? BUN 5 (*)   ? All other components within normal limits  ?CBC - Abnormal; Notable for the following components:  ? WBC 11.2 (*)   ? All other components within normal limits  ?LIPASE, BLOOD  ?URINALYSIS, ROUTINE W REFLEX MICROSCOPIC  ?I-STAT BETA HCG BLOOD, ED ([facility], [facility], [facility] ONLY)  ? ? ?EKG ?None ? ?Radiology ?DG Chest 1 View ? ?Result Date: 02/27/2022 ?CLINICAL DATA:  Left-sided rib cage pain with inspiration. EXAM: CHEST   1 VIEW COMPARISON:  09/10/2015. FINDINGS: The heart size and mediastinal contours are within normal limits. Both lungs are clear. The visualized skeletal structures are unremarkable. IMPRESSION: No active disease. Electronically Signed   By: Signa Kell M.D.   On: 02/27/2022 18:33   ? ?Procedures ?Procedures  ? ?Medications Ordered in ED ?Medications - No data to display ? ?ED Course/ Medical Decision Making/ A&P ?  ?                        ?Medical Decision Making ?Problems Addressed: ?Rib pain: acute illness or injury that poses a threat to life or bodily functions ?Right upper quadrant abdominal pain: acute illness or injury that poses a threat to life or bodily functions ? ?Amount and/or Complexity of Data Reviewed ?Labs: ordered. Decision-making details documented in ED Course. ?Radiology: ordered and independent interpretation performed. Decision-making details documented in ED Course. ? ?Risk ?Prescription drug management. ? ? ?[name] a 21 year old female with a history of cholecystectomy and right kidney stone presented to the emergency department for right lower rib pain started acutely in the setting of 2 days of cough and congestion.  Patient vital signs within the reference range.  Physical exam without acute abdomen.  No guarding or rebound.  No costovertebral tenderness.  She did have rhonchi on the right lower lung field.  ? ?Patient presentation is concerning for pneumonia.  Differential considered to include nephrolithiasis.  Less concern for hepatobiliary etiology given her history of cholecystectomy.  Less concern for appendicitis, cystitis, pyelonephritis, pancreatitis or other genitourinary etiology.  [name] well-appearing with reassuring exam. ? ?CBC did show a mild leukocytosis at 11.2.  Hemoglobin hematocrit are stable.  CMP without metabolic derangement.  Urine pregnancy negative.  Urinalysis unremarkable.  No urine blood on urinalysis.  ? ?Chest x-ray did not show acute finding.   However based on cough and congestion and positive exam will treat for pneumonia for 5 days.  I have recommended follow-up with her PCP in 2 to 3 days for reexamination.  I have discussed strict return precaution.  Patient continued to be well.  No sign of hypoxia and prior to discharge was ambulating without gait abnormality. ? ? ? ? ? ?Final Clinical Impression(s) / ED Diagnoses ?Final diagnoses:  ?Rib pain  ?Right upper quadrant abdominal pain  ? ? ?Rx / DC Orders ?ED Discharge Orders   ? ?      Ordered  ?  doxycycline (VIBRAMYCIN) 100 MG capsule  2 times daily       ? 02/27/22 1917  ? ?  ?  ? ?  ? ? ?  ?Jari Sportsman, MD ?02/27/22 2337 ? ?  ?  Benjiman CorePickering, Nathan, MD ?02/28/22 1448 ? ?

## 2022-02-27 NOTE — ED Triage Notes (Signed)
Pt here with L sided ribcage pain, worse with inspiration. Pt states onset yesterday. Pt also complains of ongoing abdominal pain for "a while".  ?

## 2022-02-27 NOTE — ED Provider Triage Note (Signed)
Emergency Medicine Provider Triage Evaluation Note ? ?[name] , a 21 y.o. female  was evaluated in triage.  Pt complains of left-sided rib cage pain and lower abdominal pain.  The abdominal pain has been going on for about a month, and the patient woke up with left-sided rib cage pain this morning.  She states it was so bad it brought her to tears.  Her abdominal pain she is concerned may be related to cysts, as there is a family history of this.  She has not "had her birth control checked".  She says she recently got over a cold.  Patient also endorses having "bacteria in her stomach", and needing an upper endoscopy. ? ?Review of Systems  ?Positive: Abdominal pain, rib cage pain worse with inspiration ?Negative: Fever, chills, cough, vomiting, diarrhea ? ?Physical Exam  ?BP 114/79 (BP Location: Right Arm)   Pulse 70   Temp 98.9 ?F (37.2 ?C) (Oral)   Resp 16   SpO2 99%  ?Gen:   Awake, no distress   ?Resp:  Normal effort  ?MSK:   Moves extremities without difficulty  ?Other:   ? ?Medical Decision Making  ?Medically screening exam initiated at 1:06 PM.  Appropriate orders placed.  Langston Summerfield was informed that the remainder of the evaluation will be completed by another provider, this initial triage assessment does not replace that evaluation, and the importance of remaining in the ED until their evaluation is complete. ? ? ?  ?Su Monks, PA-C ?02/27/22 1306 ? ?

## 2022-03-02 ENCOUNTER — Telehealth: Payer: Self-pay

## 2022-03-02 NOTE — Telephone Encounter (Signed)
Transition Care Management Unsuccessful Follow-up Telephone Call ? ?Date of discharge and from where:  02/27/2022 from [facility] ? ?Attempts:  1st Attempt ? ?Reason for unsuccessful TCM follow-up call:  Left voice message ? ? ? ?

## 2022-03-03 NOTE — Telephone Encounter (Signed)
Transition Care Management Unsuccessful Follow-up Telephone Call ? ?Date of discharge and from where:  02/27/2022 from [facility] ? ?Attempts:  2nd Attempt ? ?Reason for unsuccessful TCM follow-up call:  Left voice message ? ? ? ?

## 2022-03-04 NOTE — Telephone Encounter (Signed)
Transition Care Management Unsuccessful Follow-up Telephone Call ? ?Date of discharge and from where:  02/27/2022 from [facility] ?  ? ?Attempts:  3rd Attempt ? ?Reason for unsuccessful TCM follow-up call:  Unable to reach patient ? ? ? ?

## 2022-03-21 IMAGING — DX DG KNEE COMPLETE 4+V*R*
4 series · 4 of 4 positions shown · non-contrast
Comparison: None.

CLINICAL DATA: Pain following fall

EXAM:
RIGHT KNEE - COMPLETE 4+ VIEW

[knee ap]
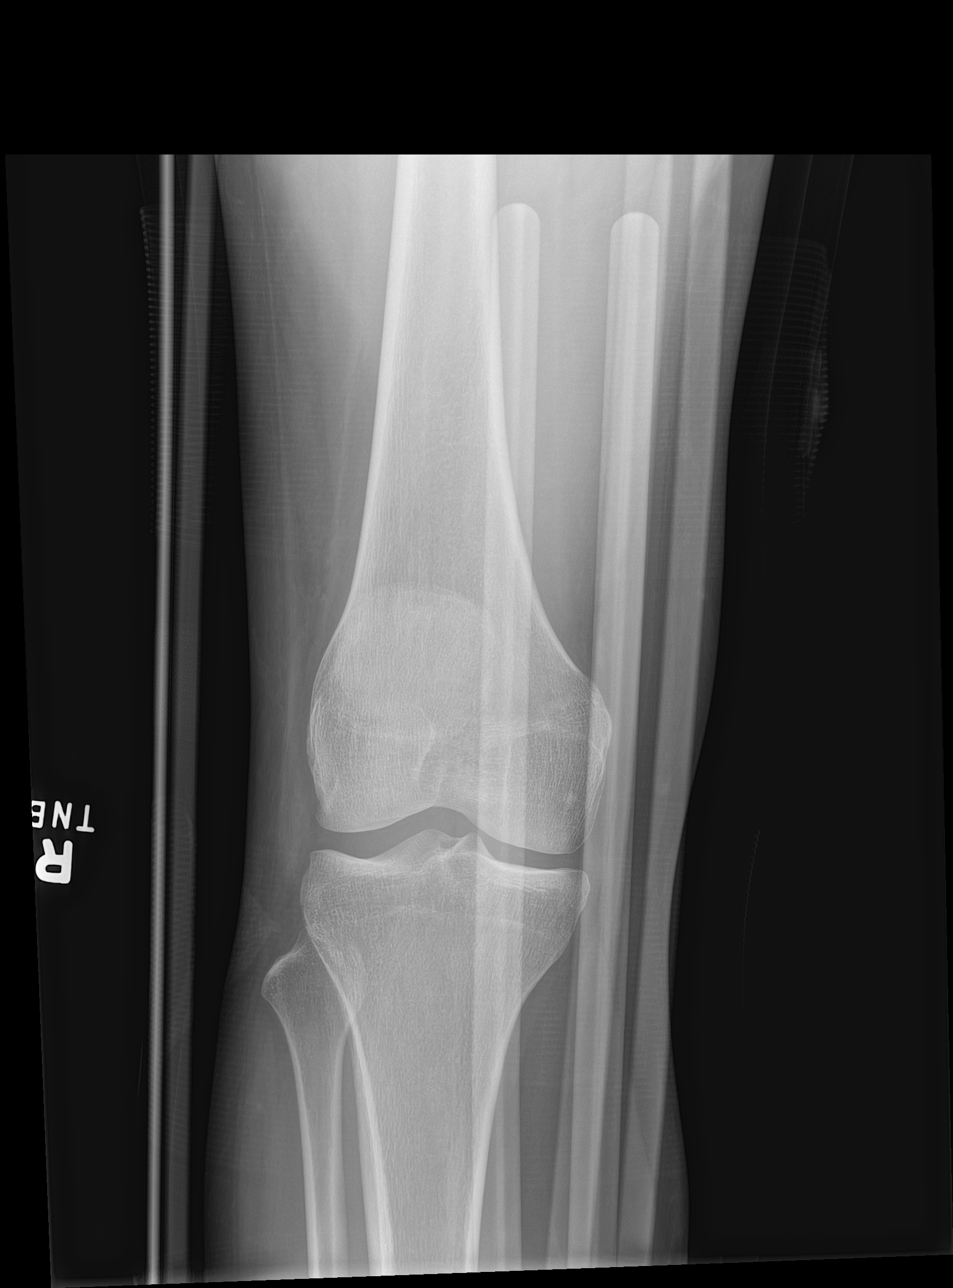

[knee lat]
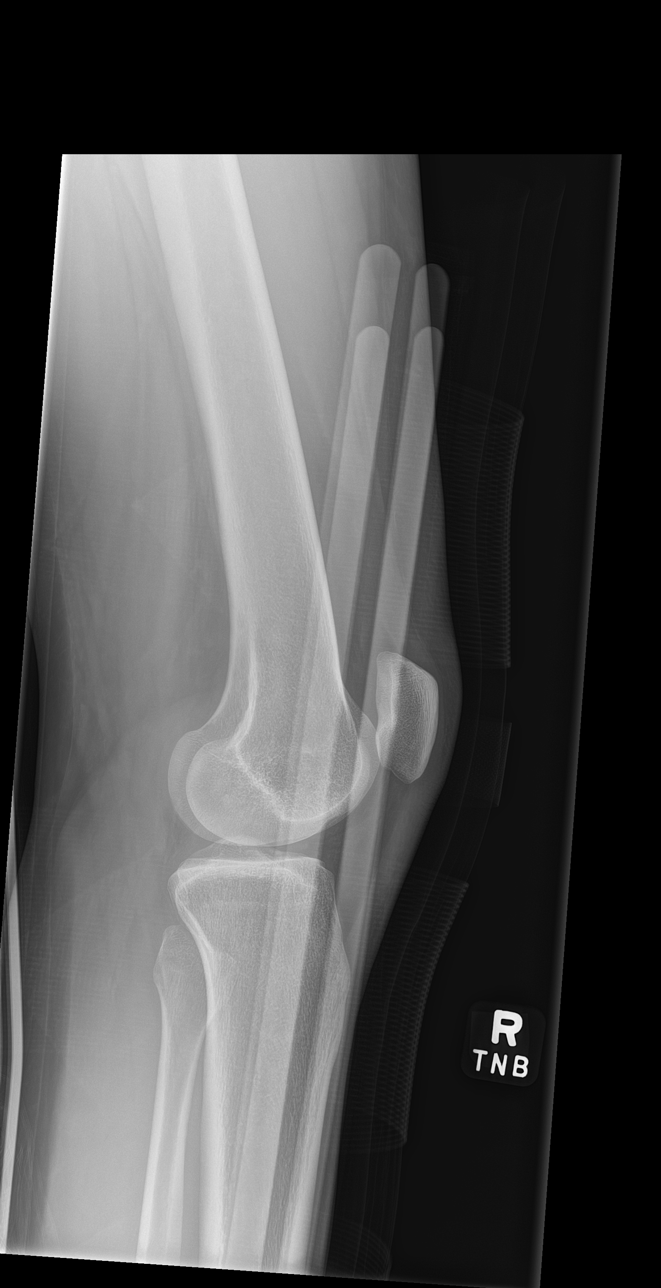

[knee obl (1 of 2)]
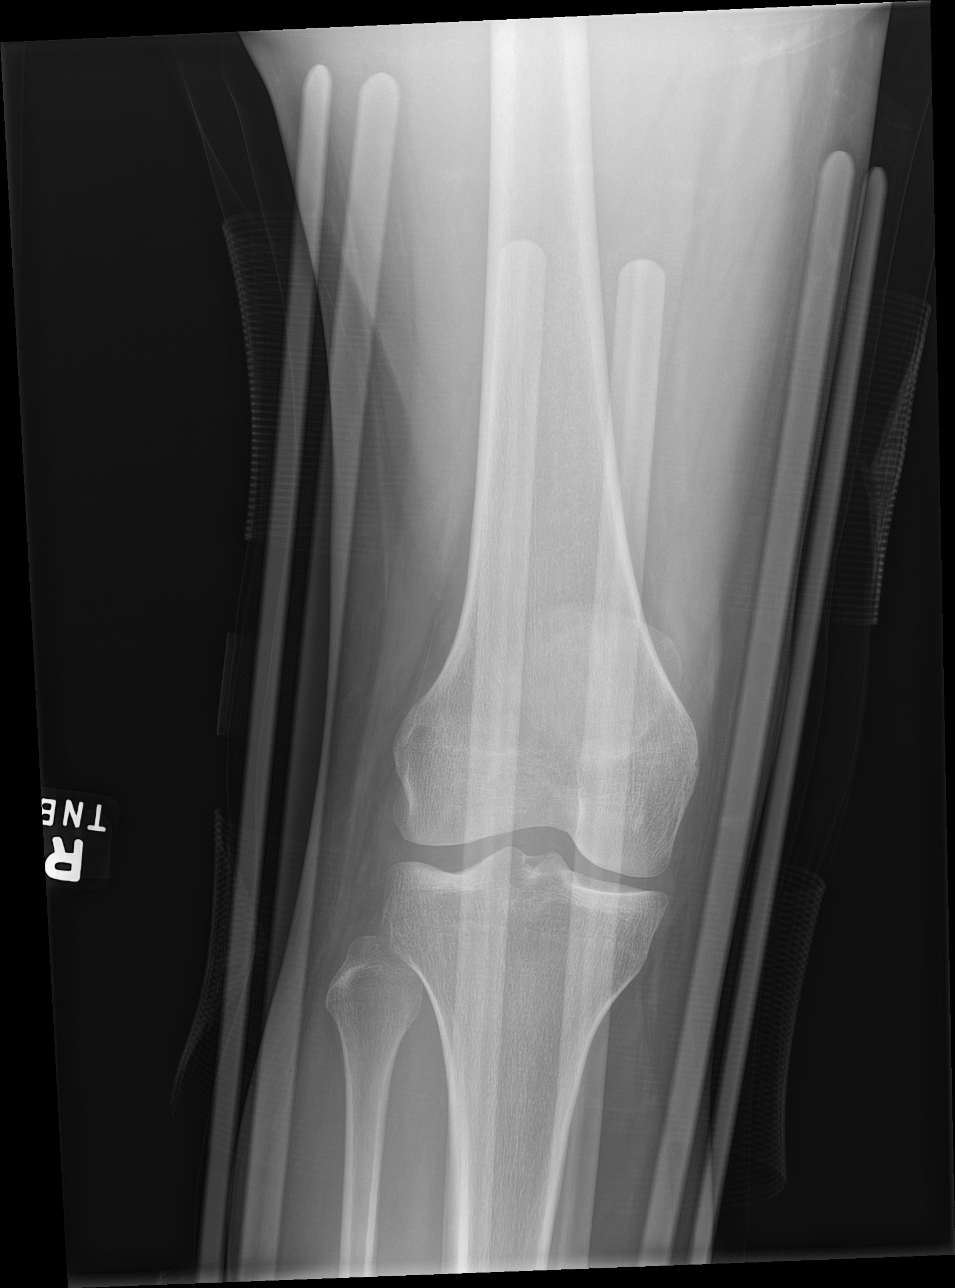

[knee obl (2 of 2)]
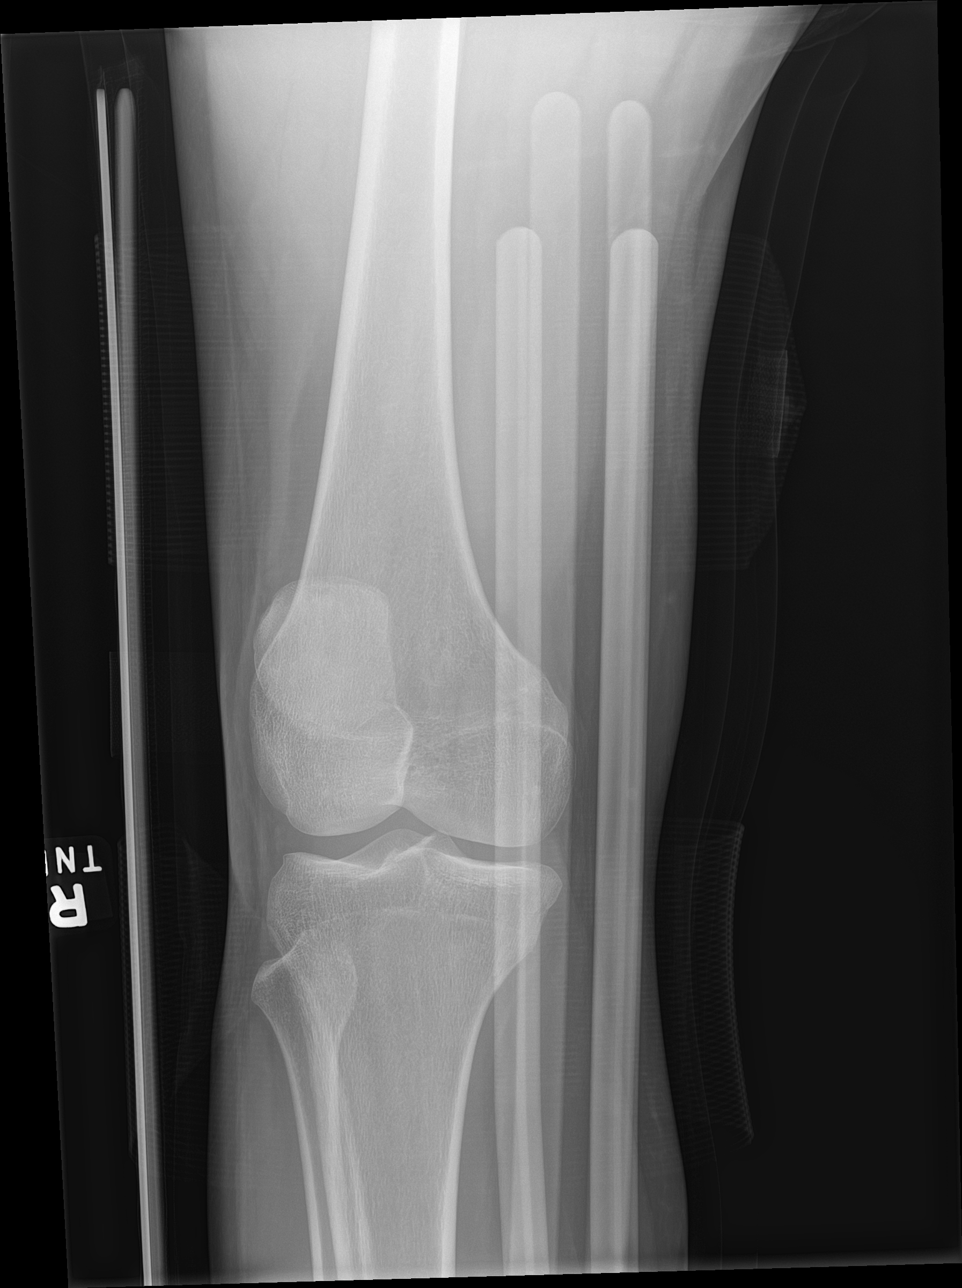

[4 of 4 positions shown; findings below may reference images not displayed]

FINDINGS: Frontal, lateral, and bilateral oblique views were obtained. There
is no fracture or dislocation. No joint effusion. Joint spaces
appear intact. No erosive change.
IMPRESSION: No fracture, dislocation, or joint effusion. No evident arthropathy.

## 2023-01-14 ENCOUNTER — Encounter: Payer: Self-pay | Admitting: Radiology

## 2023-07-06 ENCOUNTER — Encounter: Payer: Self-pay | Admitting: Obstetrics and Gynecology

## 2023-07-06 ENCOUNTER — Ambulatory Visit (INDEPENDENT_AMBULATORY_CARE_PROVIDER_SITE_OTHER): Payer: Medicaid Other | Admitting: Obstetrics and Gynecology

## 2023-07-06 VITALS — BP 96/61 | HR 54 | Ht 63.0 in | Wt 110.0 lb

## 2023-07-06 DIAGNOSIS — Z30432 Encounter for removal of intrauterine contraceptive device: Secondary | ICD-10-CM | POA: Diagnosis not present

## 2023-07-06 DIAGNOSIS — Z1339 Encounter for screening examination for other mental health and behavioral disorders: Secondary | ICD-10-CM | POA: Diagnosis not present

## 2023-07-06 NOTE — Progress Notes (Signed)
    GYNECOLOGY CLINIC PROCEDURE NOTE  Maleta Lerma is a 22 y.o. [id number] here for Mirena IUD removal. No GYN concerns.  Last pap smear was on 5/22 and was normal.  IUD Removal  Patient identified, informed consent performed, consent signed.  Patient was in the dorsal lithotomy position, normal external genitalia was noted.  A speculum was placed in the patient's vagina, normal discharge was noted, no lesions. The cervix was visualized, no lesions, no abnormal discharge.  The strings of the IUD were grasped and pulled using ring forceps. The IUD was removed in its entirety.  Patient tolerated the procedure well.    Patient plans for pregnancy soon and she was told to avoid teratogens, take PNV and folic acid.  Routine preventative health maintenance measures emphasized.  Nettie Elm, MD, FACOG Attending Obstetrician & Gynecologist Center for [facility], [facility]

## 2023-07-06 NOTE — Progress Notes (Signed)
22 y.o. GYN presents for IUD Removal.

## 2023-08-15 ENCOUNTER — Emergency Department (HOSPITAL_BASED_OUTPATIENT_CLINIC_OR_DEPARTMENT_OTHER)
Admission: EM | Admit: 2023-08-15 | Discharge: 2023-08-15 | Disposition: A | Discharge status: Home / Self Care | Payer: Medicaid Other | Attending: Emergency Medicine | Admitting: Emergency Medicine

## 2023-08-15 DIAGNOSIS — B349 Viral infection, unspecified: Secondary | ICD-10-CM | POA: Diagnosis not present

## 2023-08-15 DIAGNOSIS — N939 Abnormal uterine and vaginal bleeding, unspecified: Secondary | ICD-10-CM | POA: Insufficient documentation

## 2023-08-15 DIAGNOSIS — Z1152 Encounter for screening for COVID-19: Secondary | ICD-10-CM | POA: Diagnosis not present

## 2023-08-15 LAB — CBC
HCT: 35.2 % — ABNORMAL LOW (ref 36.0–46.0)
Hemoglobin: 12.2 g/dL (ref 12.0–15.0)
MCH: 31.4 pg (ref 26.0–34.0)
MCHC: 34.7 g/dL (ref 30.0–36.0)
MCV: 90.5 fL (ref 80.0–100.0)
Platelets: 240 10*3/uL (ref 150–400)
RBC: 3.89 MIL/uL (ref 3.87–5.11)
RDW: 12.3 % (ref 11.5–15.5)
WBC: 8.3 10*3/uL (ref 4.0–10.5)
nRBC: 0 % (ref 0.0–0.2)

## 2023-08-15 LAB — URINALYSIS, ROUTINE W REFLEX MICROSCOPIC
Bilirubin Urine: NEGATIVE
Glucose, UA: NEGATIVE mg/dL
Ketones, ur: NEGATIVE mg/dL
Nitrite: NEGATIVE
Protein, ur: NEGATIVE mg/dL
Specific Gravity, Urine: 1.011 (ref 1.005–1.030)
pH: 7 (ref 5.0–8.0)

## 2023-08-15 LAB — RESP PANEL BY RT-PCR (RSV, FLU A&B, COVID)  RVPGX2
Influenza A by PCR: NEGATIVE
Influenza B by PCR: NEGATIVE
Resp Syncytial Virus by PCR: NEGATIVE
SARS Coronavirus 2 by RT PCR: NEGATIVE

## 2023-08-15 LAB — BASIC METABOLIC PANEL
Anion gap: 6 (ref 5–15)
BUN: 13 mg/dL (ref 6–20)
CO2: 25 mmol/L (ref 22–32)
Calcium: 8.4 mg/dL — ABNORMAL LOW (ref 8.9–10.3)
Chloride: 107 mmol/L (ref 98–111)
Creatinine, Ser: 0.7 mg/dL (ref 0.44–1.00)
GFR, Estimated: >60 mL/min (ref >60)
Glucose, Bld: 97 mg/dL (ref 70–99)
Potassium: 3.6 mmol/L (ref 3.5–5.1)
Sodium: 138 mmol/L (ref 135–145)

## 2023-08-15 LAB — PREGNANCY, URINE: Preg Test, Ur: NEGATIVE

## 2023-08-15 NOTE — Discharge Instructions (Signed)
Drink plenty of fluids and get plenty of rest.  Follow-up with primary doctor if not improving in the next few days, and return to the ER if you develop any new and/or concerning issues.

## 2023-08-15 NOTE — ED Provider Notes (Signed)
[place] EMERGENCY DEPARTMENT AT [facility] Provider Note   CSN: [id number] Arrival date & time: 08/15/23  0231     History  Chief Complaint  Patient presents with   Vaginal Bleeding    Stefanie King is a 22 y.o. female.  [name] a 22 year old female with past medical history of migraines, depression.  Patient presenting today for evaluation of headache and feeling generally unwell.  This has been ongoing for the past 2 days, then this evening while lying in bed she felt flushed had a headache, and felt as though she needed to be evaluated.  She also reports vaginal bleeding for the past 2 days.  She had her IUD removed 2 months ago.  The history is provided by the patient.       Home Medications Prior to Admission medications   Not on File      Allergies    Omnicef [cefdinir]    Review of Systems   Review of Systems  All other systems reviewed and are negative.   Physical Exam Updated Vital Signs BP 102/74 (BP Location: Right Arm)   Pulse 79   Temp 98.4 F (36.9 C) (Oral)   Resp 17   Wt 49.9 kg   LMP 08/15/2023 (Exact Date)   SpO2 98%   BMI 19.49 kg/m  Physical Exam Vitals and nursing note reviewed.  Constitutional:      General: She is not in acute distress.    Appearance: She is well-developed. She is not diaphoretic.  HENT:     Head: Normocephalic and atraumatic.  Cardiovascular:     Rate and Rhythm: Normal rate and regular rhythm.     Heart sounds: No murmur heard.    No friction rub. No gallop.  Pulmonary:     Effort: Pulmonary effort is normal. No respiratory distress.     Breath sounds: Normal breath sounds. No wheezing.  Abdominal:     General: Bowel sounds are normal. There is no distension.     Palpations: Abdomen is soft.     Tenderness: There is no abdominal tenderness.  Musculoskeletal:        General: Normal range of motion.     Cervical back: Normal range of motion and neck supple.  Skin:    General: Skin is warm  and dry.  Neurological:     General: No focal deficit present.     Mental Status: She is alert and oriented to person, place, and time.     ED Results / Procedures / Treatments   Labs (all labs ordered are listed, but only abnormal results are displayed) Labs Reviewed  CBC - Abnormal; Notable for the following components:      Result Value   HCT 35.2 (*)    All other components within normal limits  RESP PANEL BY RT-PCR (RSV, FLU A&B, COVID)  RVPGX2  PREGNANCY, URINE  URINALYSIS, ROUTINE W REFLEX MICROSCOPIC  BASIC METABOLIC PANEL    EKG None  Radiology No results found.  Procedures Procedures    Medications Ordered in ED Medications - No data to display  ED Course/ Medical Decision Making/ A&P  [name] a 22 year old female presenting with complaints as described in the HPI.  She arrives here with stable vital signs and is afebrile.  Physical examination is unremarkable.  Workup initiated including CBC and metabolic panel, both of which were unremarkable.  Urinalysis is clear and pregnancy test is negative.  Respiratory panel negative for COVID/flu/RSV.  At this point,  cause of patient's symptoms are unclear, but she appears stable and I believe can safely be discharged.  Symptoms may be viral.  Final Clinical Impression(s) / ED Diagnoses Final diagnoses:  None    Rx / DC Orders ED Discharge Orders     None         Geoffery Lyons, MD 08/15/23 [phone]

## 2023-08-15 NOTE — ED Triage Notes (Signed)
Pt to triage c/o vaginal bleeding x 2 days. Pt states she is having unexpected vaginal bleeding following IUD removal 2 months ago. Pt states she is bleeding through 10 pads a day. Pt VSS NAD on room air. Pt denies fever SOB.

## 2024-04-03 ENCOUNTER — Ambulatory Visit: Admitting: *Deleted

## 2024-04-03 VITALS — BP 131/86 | HR 66 | Wt 108.0 lb

## 2024-04-03 DIAGNOSIS — Z3200 Encounter for pregnancy test, result unknown: Secondary | ICD-10-CM

## 2024-04-03 DIAGNOSIS — Z3201 Encounter for pregnancy test, result positive: Secondary | ICD-10-CM | POA: Diagnosis not present

## 2024-04-03 DIAGNOSIS — O219 Vomiting of pregnancy, unspecified: Secondary | ICD-10-CM

## 2024-04-03 LAB — POCT URINE PREGNANCY: Preg Test, Ur: POSITIVE — AB

## 2024-04-03 MED ORDER — PROMETHAZINE HCL 25 MG PO TABS: 25.0000 mg | ORAL_TABLET | Freq: Four times a day (QID) | ORAL | 1 refills | Status: AC | PRN

## 2024-04-03 NOTE — Progress Notes (Signed)
 Stefanie King presents today for UPT. She has no unusual complaints and complains of abdominal pain in the lower midline. Cramping, not sustained. Denies VB. LMP: 03/01/24    OBJECTIVE: Appears well, in no apparent distress.  OB History     Gravida  2   Para  2   Term  2   Preterm      AB      Living  2      SAB      IAB      Ectopic      Multiple  0   Live Births  2          Home UPT Result:Positive In-Office UPT result:Positive I have reviewed the patient's medical, obstetrical, social, and family histories, and medications.   ASSESSMENT: Positive pregnancy test  PLAN Prenatal care to be completed at:  [facility]  US /Intake visit scheduled at check out. SAB, ectopic and extreme N/V precautions reviewed.

## 2024-04-05 ENCOUNTER — Encounter

## 2024-04-05 ENCOUNTER — Inpatient Hospital Stay (HOSPITAL_COMMUNITY)
Admission: AD | Admit: 2024-04-05 | Discharge: 2024-04-06 | Disposition: A | Discharge status: Home / Self Care | Attending: Obstetrics and Gynecology | Admitting: Obstetrics and Gynecology

## 2024-04-05 DIAGNOSIS — B9689 Other specified bacterial agents as the cause of diseases classified elsewhere: Secondary | ICD-10-CM

## 2024-04-05 DIAGNOSIS — O008 Other ectopic pregnancy without intrauterine pregnancy: Secondary | ICD-10-CM | POA: Insufficient documentation

## 2024-04-05 DIAGNOSIS — R109 Unspecified abdominal pain: Secondary | ICD-10-CM | POA: Diagnosis not present

## 2024-04-05 DIAGNOSIS — O00101 Right tubal pregnancy without intrauterine pregnancy: Secondary | ICD-10-CM

## 2024-04-05 DIAGNOSIS — O26891 Other specified pregnancy related conditions, first trimester: Secondary | ICD-10-CM | POA: Insufficient documentation

## 2024-04-05 DIAGNOSIS — Z3A01 Less than 8 weeks gestation of pregnancy: Secondary | ICD-10-CM | POA: Diagnosis not present

## 2024-04-05 DIAGNOSIS — N760 Acute vaginitis: Secondary | ICD-10-CM | POA: Diagnosis not present

## 2024-04-05 LAB — WET PREP, GENITAL
Sperm: NONE SEEN
Trich, Wet Prep: NONE SEEN
WBC, Wet Prep HPF POC: >=10 — AB (ref <10)
Yeast Wet Prep HPF POC: NONE SEEN

## 2024-04-05 LAB — URINALYSIS, ROUTINE W REFLEX MICROSCOPIC
Bacteria, UA: NONE SEEN
Bilirubin Urine: NEGATIVE
Glucose, UA: NEGATIVE mg/dL
Hgb urine dipstick: NEGATIVE
Ketones, ur: 5 mg/dL — AB
Leukocytes,Ua: NEGATIVE
Nitrite: NEGATIVE
Protein, ur: 30 mg/dL — AB
Specific Gravity, Urine: 1.021 (ref 1.005–1.030)
pH: 7 (ref 5.0–8.0)

## 2024-04-05 NOTE — MAU Note (Signed)
..  Stefanie King is a 23 y.o. at [redacted]w[redacted]d here in MAU reporting: right sided abdominal pain that began about a week ago, had some pain when she went to office a couple days ago but was advised to come in if it continued. Denies vaginal bleeding.   Pain score: 4/10 Vitals:   04/05/24 2237  BP: 117/65  Pulse: 75  Resp: 16  Temp: 98.2 F (36.8 C)  SpO2: 100%     FHT:n/a Lab orders placed from triage:  UA

## 2024-04-06 ENCOUNTER — Inpatient Hospital Stay (HOSPITAL_COMMUNITY)

## 2024-04-06 DIAGNOSIS — O26891 Other specified pregnancy related conditions, first trimester: Secondary | ICD-10-CM

## 2024-04-06 DIAGNOSIS — B9689 Other specified bacterial agents as the cause of diseases classified elsewhere: Secondary | ICD-10-CM | POA: Diagnosis not present

## 2024-04-06 DIAGNOSIS — N760 Acute vaginitis: Secondary | ICD-10-CM

## 2024-04-06 DIAGNOSIS — Z3A01 Less than 8 weeks gestation of pregnancy: Secondary | ICD-10-CM

## 2024-04-06 LAB — COMPREHENSIVE METABOLIC PANEL WITH GFR
ALT: 14 U/L (ref 0–44)
AST: 17 U/L (ref 15–41)
Albumin: 4.3 g/dL (ref 3.5–5.0)
Alkaline Phosphatase: 45 U/L (ref 38–126)
Anion gap: 9 (ref 5–15)
BUN: 8 mg/dL (ref 6–20)
CO2: 22 mmol/L (ref 22–32)
Calcium: 8.8 mg/dL — ABNORMAL LOW (ref 8.9–10.3)
Chloride: 105 mmol/L (ref 98–111)
Creatinine, Ser: 0.68 mg/dL (ref 0.44–1.00)
GFR, Estimated: >60 mL/min (ref >60)
Glucose, Bld: 95 mg/dL (ref 70–99)
Potassium: 3.5 mmol/L (ref 3.5–5.1)
Sodium: 136 mmol/L (ref 135–145)
Total Bilirubin: 0.3 mg/dL (ref 0.0–1.2)
Total Protein: 7.1 g/dL (ref 6.5–8.1)

## 2024-04-06 LAB — CBC
HCT: 38.3 % (ref 36.0–46.0)
Hemoglobin: 13.6 g/dL (ref 12.0–15.0)
MCH: 31.7 pg (ref 26.0–34.0)
MCHC: 35.5 g/dL (ref 30.0–36.0)
MCV: 89.3 fL (ref 80.0–100.0)
Platelets: 296 10*3/uL (ref 150–400)
RBC: 4.29 MIL/uL (ref 3.87–5.11)
RDW: 12.4 % (ref 11.5–15.5)
WBC: 10.7 10*3/uL — ABNORMAL HIGH (ref 4.0–10.5)
nRBC: 0 % (ref 0.0–0.2)

## 2024-04-06 LAB — ABO/RH: ABO/RH(D): A POS

## 2024-04-06 LAB — GC/CHLAMYDIA PROBE AMP (~~LOC~~) NOT AT ARMC
Chlamydia: NEGATIVE
Comment: NEGATIVE
Comment: NORMAL
Neisseria Gonorrhea: NEGATIVE

## 2024-04-06 MED ORDER — SCOPOLAMINE 1 MG/3DAYS TD PT72: 1.0000 | MEDICATED_PATCH | Freq: Once | TRANSDERMAL | Status: DC

## 2024-04-06 MED ORDER — METHOTREXATE FOR ECTOPIC PREGNANCY
50.0000 mg/m2 | Freq: Once | INTRAMUSCULAR | Status: AC
  Administered 2024-04-06: 75 mg via INTRAMUSCULAR
  Filled 2024-04-06: qty 3

## 2024-04-06 NOTE — Discharge Instructions (Signed)
 Methotrexate Treatment Protocol for Ectopic Pregnancy  Discontinue folic acid  supplements (ie. Prenatal vitamins) Avoid NSAIDs (ie Motrin , Ibuprofen , Aleve , Advil ) , recommend acetaminophen  (Tylenol ) as needed  Refrain from sexual intercourse and strenuous exercise  Treatment day  Single dose protocol   1  hCG.  Administer Methotrexate   4 04/09/24 hCG  7 04/12/24 hCG  If <15 percent hCG decline from day 4 to 7, give additional dose of methotrexate 50 mg/m2 IM  If >=15 percent hCG decline from day 4 to 7, draw hCG weekly until undetectable  14  hCG  If <15 percent hCG decline from day 7 to 14, give additional dose of methotrexate 50 mg/m2 IM  If >=15 percent hCG decline from day 7 to 14, check hCG weekly until undetectable  21 and 28  If 3 doses have been given and there is a <15 percent hCG decline from day 21 to 28, proceed with laparoscopic surgery

## 2024-04-06 NOTE — MAU Provider Note (Signed)
 R sided abd pain    [name] is a 23 y.o. G1P2002 pregnant female at [redacted]w[redacted]d who presents to MAU today with complaint of chronic right sided abdominal pain. Pt states she has had right sided abdominal pain for about a week and when she called her office a couple days ago she was advised to come in for evaluation.  She presents today for unchanged abdominal pain, 4/10 pain.  Denies VB.     Pertinent items noted in HPI and remainder of comprehensive ROS otherwise negative.   O BP 117/65 (BP Location: Right Arm)   Pulse 75   Temp 98.2 F (36.8 C) (Oral)   Resp 16   Ht 5\' 4"  (1.626 m)   Wt 49.4 kg   LMP 03/01/2024 (Exact Date)   SpO2 100%   BMI 18.71 kg/m  Physical Exam Vitals and nursing note reviewed.  Constitutional:      General: She is not in acute distress.    Appearance: She is well-developed and normal weight. She is not ill-appearing.  HENT:     Head: Normocephalic and atraumatic.     Mouth/Throat:     Mouth: Mucous membranes are moist.     Pharynx: Oropharynx is clear.  Eyes:     Extraocular Movements: Extraocular movements intact.  Cardiovascular:     Rate and Rhythm: Normal rate.  Pulmonary:     Effort: Pulmonary effort is normal. No respiratory distress.  Abdominal:     General: Abdomen is flat. There is no distension.     Palpations: Abdomen is soft.     Tenderness: There is abdominal tenderness (mild RLQ TTP).  Skin:    General: Skin is warm and dry.  Neurological:     General: No focal deficit present.     Mental Status: She is alert and oriented to person, place, and time.     Motor: No weakness.  Psychiatric:        Mood and Affect: Mood normal.        Behavior: Behavior normal.      MDM: MAU Course:  CBC 10.7 WBC, Hgb 13.6, Plts 296 ABO A+ Beta hCG pending CMP reassuring  TVUS = unruptured 3.3 cm R adnexal ectopic   Methotrexate Treatment Protocol for Ectopic Pregnancy  Pretreatment testing and instructions  hCG concentration   Transvaginal ultrasound  Blood group and Rh(D) typing; give Rhogam 300 mcg IM, if indicated  Complete blood count  Liver and renal function tests  Discontinue folic acid  supplements  Counsel patient to avoid NSAIDs, recommend acetaminophen  if an analgesic is needed  Advise patient to refrain from sexual intercourse and strenuous exercise  Treatment day  Single dose protocol   1  hCG.  Administer Methotrexate 50 mg/m2 body surface area IM  4  hCG  7  hCG  If <15 percent hCG decline from day 4 to 7, give additional dose of methotrexate 50 mg/m2 IM  If >=15 percent hCG decline from day 4 to 7, draw hCG weekly until undetectable  14  hCG  If <15 percent hCG decline from day 7 to 14, give additional dose of methotrexate 50 mg/m2 IM  If >=15 percent hCG decline from day 7 to 14, check hCG weekly until undetectable  21 and 28  If 3 doses have been given and there is a <15 percent hCG decline from day 21 to 28, proceed with laparoscopic surgery  Laparoscopy  If severe abdominal pain or an acute abdomen suggestive of  tubal rupture occurs If ultrasonography reveals greater than 300 mL pelvic or other intraperitoneal fluid  The hCG concentration usually declines to less than 15 mIU/mL by 35 days postinjection but may take as long as 109 days. If the hCG does not decline to zero, a new pregnancy should be excluded; if the hCG is rising, a transvaginal ultrasound should be performed. Alternatively, some patients have a slow clearance of serum hCG. If three weekly values are similar, consider an additional dose of MTX (50 mg/m2) not to exceed the recommended maximum of three total doses. This typically accelerates the decline of serum hCG. The risk of gestational trophoblastic disease is low. Folinic acid rescue is not required for women treated with the single-dose protocol, even if multiple doses are ultimately given.   Prepared with data from:   [facility]. Clinical practice. Ectopic pregnancy. Mel Spine Med 2009; 361:379  American College of Obstetricians and Gynecologists. ACOG Practice Bulletin No. 94: Medical management of ectopic pregnancy. Obstet Gynecol 2008; 409:8119.    AP #[redacted] weeks gestation #R ectopic  Given methotrexate  and instructed as above.   Discharge from MAU in stable condition with strict/usual precautions Follow up at desired OBGYN as scheduled for ongoing prenatal care  Allergies as of 04/06/2024       Reactions   Omnicef  [cefdinir ] Other (See Comments)   Dizzy, abd pains        Medication List     STOP taking these medications    PRENATAL VITAMIN PO       TAKE these medications    promethazine  25 MG tablet Commonly known as: PHENERGAN  Take 1 tablet (25 mg total) by mouth every 6 (six) hours as needed for nausea or vomiting.        Stefanie Goldstein, MD 04/06/2024 2:55 AM

## 2024-04-07 LAB — BETA HCG QUANT (REF LAB): hCG Quant: 1909 m[IU]/mL

## 2024-04-09 ENCOUNTER — Other Ambulatory Visit: Payer: Self-pay

## 2024-04-09 ENCOUNTER — Inpatient Hospital Stay (HOSPITAL_COMMUNITY)
Admission: AD | Admit: 2024-04-09 | Discharge: 2024-04-09 | Disposition: A | Discharge status: Home / Self Care | Source: Ambulatory Visit | Attending: Family Medicine | Admitting: Family Medicine

## 2024-04-09 DIAGNOSIS — O00101 Right tubal pregnancy without intrauterine pregnancy: Secondary | ICD-10-CM | POA: Diagnosis not present

## 2024-04-09 DIAGNOSIS — Z3A01 Less than 8 weeks gestation of pregnancy: Secondary | ICD-10-CM | POA: Diagnosis not present

## 2024-04-09 LAB — HCG, QUANTITATIVE, PREGNANCY: hCG, Beta Chain, Quant, S: 1591 m[IU]/mL — ABNORMAL HIGH (ref <5)

## 2024-04-09 NOTE — MAU Note (Signed)
.  Stefanie King is a 23 y.o. at [redacted]w[redacted]d here in MAU reporting: Patient reports she is hear for follow up after methotrexate. Reports some abdominal cramping you were told to expect   Pain score: 3/10 There were no vitals filed for this visit.

## 2024-04-09 NOTE — MAU Provider Note (Signed)
  S [name] is a 23 y.o. [id number] patient who presents to MAU today on Day 4 s/p methotrexate administered on 04/06/24 for a right ectopic pregnancy. She offers no c/o today other than minimal vaginal bleeding   O BP 115/67   Pulse 76   Temp 98 F (36.7 C)   Resp 12   LMP 03/01/2024 (Exact Date)   SpO2 100%  Physical Exam Vitals and nursing note reviewed.  Constitutional:      General: She is not in acute distress.    Appearance: Normal appearance. She is not ill-appearing.  HENT:     Head: Normocephalic.     Nose: Nose normal.     Mouth/Throat:     Mouth: Mucous membranes are moist.  Cardiovascular:     Rate and Rhythm: Normal rate.  Pulmonary:     Effort: Pulmonary effort is normal.  Abdominal:     Palpations: Abdomen is soft.  Musculoskeletal:        General: Normal range of motion.     Cervical back: Normal range of motion.  Skin:    General: Skin is warm.  Neurological:     Mental Status: She is alert and oriented to person, place, and time.  Psychiatric:        Mood and Affect: Mood normal.        Behavior: Behavior normal.     MDM  Moderate    Day 4 s/p methotrexate for ectopic pregnancy - Bhcg today is 1,591 down from 1,909 on 04/06/24  - Patient to return for f/u Hcg on D7 ( Wed 04/12/24) Appointment scheduled for patient     Orders Placed This Encounter  Procedures   hCG, quantitative, pregnancy    Standing Status:   Standing    Number of Occurrences:   1   Discharge patient Discharge disposition: 01-Home or Self Care; Discharge patient date: 04/09/2024    Standing Status:   Standing    Number of Occurrences:   1    Discharge disposition:   01-Home or Self Care [1]    Discharge patient date:   04/09/2024    Results for orders placed or performed during the hospital encounter of 04/09/24 (from the past 24 hours)  hCG, quantitative, pregnancy     Status: Abnormal   Collection Time: 04/09/24  3:32 PM  Result Value Ref Range   hCG, Beta  Chain, Quant, S 1,591 (H) <5 mIU/mL      I have reviewed the patient chart and performed the physical exam.   Counseling and education provided and patient agreeable  with plan as described below. Verbalized understanding.    ASSESSMENT Medical screening exam complete  1. Right tubal pregnancy without intrauterine pregnancy (Primary)  2. [redacted] weeks gestation of pregnancy   3. S/P treatment with methotrexate    PLAN Future Appointments  Date Time Provider Department Center  04/12/2024  9:25 AM [facility] LAB [facility] None  04/26/2024 10:15 AM [facility] INTAKE [facility] None  05/29/2024  3:10 PM Constant, Peggy, MD [facility] None    Discharge from MAU in stable condition  See AVS for full description of educational information and instructions provided to the patient at time of discharge   Warning signs for worsening condition that would warrant emergency follow-up discussed Patient may return to MAU as needed   Cherlynn Cornfield, NP 04/09/2024 9:06 PM

## 2024-04-12 ENCOUNTER — Telehealth: Payer: Self-pay

## 2024-04-12 ENCOUNTER — Other Ambulatory Visit (INDEPENDENT_AMBULATORY_CARE_PROVIDER_SITE_OTHER): Payer: Self-pay

## 2024-04-12 VITALS — BP 111/66 | HR 79

## 2024-04-12 DIAGNOSIS — Z3A01 Less than 8 weeks gestation of pregnancy: Secondary | ICD-10-CM | POA: Diagnosis not present

## 2024-04-12 DIAGNOSIS — O00101 Right tubal pregnancy without intrauterine pregnancy: Secondary | ICD-10-CM | POA: Diagnosis not present

## 2024-04-12 LAB — BETA HCG QUANT (REF LAB): hCG Quant: 751 m[IU]/mL

## 2024-04-12 NOTE — Telephone Encounter (Signed)
 TC to pt about stat hcg results, no answer, unable to leave VM. Recommendations from Dr. Racheal Buddle to follow up with weekly hcg until WNL. Pt scheduled for hcg lab on 6/4 at 1000.

## 2024-04-12 NOTE — Progress Notes (Signed)
 Pt reports for STAT bHCG. Diagnosed ectopic pregnancy on 04/09/24. Trending HCG down. Cramping in right side, no bleeding currently. Stopped bleeding yesterday.   No other complaints. Informed pt we would follow up with results, and inform her of next steps.

## 2024-04-13 ENCOUNTER — Telehealth: Payer: Self-pay

## 2024-04-13 ENCOUNTER — Inpatient Hospital Stay (HOSPITAL_COMMUNITY)

## 2024-04-13 ENCOUNTER — Inpatient Hospital Stay (HOSPITAL_COMMUNITY)
Admission: AD | Admit: 2024-04-13 | Discharge: 2024-04-13 | Disposition: A | Discharge status: Home / Self Care | Payer: Self-pay | Attending: Obstetrics & Gynecology | Admitting: Obstetrics & Gynecology

## 2024-04-13 ENCOUNTER — Other Ambulatory Visit: Payer: Self-pay

## 2024-04-13 DIAGNOSIS — R102 Pelvic and perineal pain: Secondary | ICD-10-CM | POA: Diagnosis not present

## 2024-04-13 DIAGNOSIS — O00101 Right tubal pregnancy without intrauterine pregnancy: Secondary | ICD-10-CM

## 2024-04-13 DIAGNOSIS — R1031 Right lower quadrant pain: Secondary | ICD-10-CM | POA: Diagnosis present

## 2024-04-13 DIAGNOSIS — R109 Unspecified abdominal pain: Secondary | ICD-10-CM | POA: Diagnosis present

## 2024-04-13 DIAGNOSIS — O3680X0 Pregnancy with inconclusive fetal viability, not applicable or unspecified: Secondary | ICD-10-CM | POA: Diagnosis not present

## 2024-04-13 DIAGNOSIS — Z3A00 Weeks of gestation of pregnancy not specified: Secondary | ICD-10-CM | POA: Diagnosis not present

## 2024-04-13 DIAGNOSIS — O26899 Other specified pregnancy related conditions, unspecified trimester: Secondary | ICD-10-CM | POA: Diagnosis not present

## 2024-04-13 DIAGNOSIS — Z3A01 Less than 8 weeks gestation of pregnancy: Secondary | ICD-10-CM

## 2024-04-13 LAB — CBC WITH DIFFERENTIAL/PLATELET
Abs Immature Granulocytes: 0.06 10*3/uL (ref 0.00–0.07)
Basophils Absolute: 0.1 10*3/uL (ref 0.0–0.1)
Basophils Relative: 1 %
Eosinophils Absolute: 0.1 10*3/uL (ref 0.0–0.5)
Eosinophils Relative: 1 %
HCT: 41 % (ref 36.0–46.0)
Hemoglobin: 13.9 g/dL (ref 12.0–15.0)
Immature Granulocytes: 1 %
Lymphocytes Relative: 25 %
Lymphs Abs: 2.4 10*3/uL (ref 0.7–4.0)
MCH: 30.5 pg (ref 26.0–34.0)
MCHC: 33.9 g/dL (ref 30.0–36.0)
MCV: 90.1 fL (ref 80.0–100.0)
Monocytes Absolute: 0.6 10*3/uL (ref 0.1–1.0)
Monocytes Relative: 7 %
Neutro Abs: 6.2 10*3/uL (ref 1.7–7.7)
Neutrophils Relative %: 65 %
Platelets: 324 10*3/uL (ref 150–400)
RBC: 4.55 MIL/uL (ref 3.87–5.11)
RDW: 12 % (ref 11.5–15.5)
WBC: 9.4 10*3/uL (ref 4.0–10.5)
nRBC: 0 % (ref 0.0–0.2)

## 2024-04-13 LAB — COMPREHENSIVE METABOLIC PANEL WITH GFR
ALT: 21 U/L (ref 0–44)
AST: 20 U/L (ref 15–41)
Albumin: 4.2 g/dL (ref 3.5–5.0)
Alkaline Phosphatase: 48 U/L (ref 38–126)
Anion gap: 9 (ref 5–15)
BUN: <5 mg/dL — ABNORMAL LOW (ref 6–20)
CO2: 24 mmol/L (ref 22–32)
Calcium: 9.3 mg/dL (ref 8.9–10.3)
Chloride: 105 mmol/L (ref 98–111)
Creatinine, Ser: 0.71 mg/dL (ref 0.44–1.00)
GFR, Estimated: >60 mL/min (ref >60)
Glucose, Bld: 93 mg/dL (ref 70–99)
Potassium: 3.7 mmol/L (ref 3.5–5.1)
Sodium: 138 mmol/L (ref 135–145)
Total Bilirubin: 0.4 mg/dL (ref 0.0–1.2)
Total Protein: 7.6 g/dL (ref 6.5–8.1)

## 2024-04-13 MED ORDER — OXYCODONE HCL 5 MG PO TABS
5.0000 mg | ORAL_TABLET | Freq: Once | ORAL | Status: AC
  Administered 2024-04-13: 5 mg via ORAL
  Filled 2024-04-13: qty 1

## 2024-04-13 MED ORDER — OXYCODONE HCL 5 MG PO TABS: 5.0000 mg | ORAL_TABLET | Freq: Four times a day (QID) | ORAL | 0 refills | Status: AC | PRN

## 2024-04-13 NOTE — MAU Note (Signed)
 Stefanie King is a 23 y.o. at [redacted]w[redacted]d here in MAU reporting: she's had increased abdominal pain that began today.  Reports right sided intermittent sharp, stabbing, twisting pain.  Denies VB, has brown vaginal discharge.  States hasn't taken any meds for pain. Reports had f/u blood work yesterday, doesn't know results.  LMP: 03/01/2024 Onset of complaint: today Pain score: 6 Vitals:   04/13/24 1740  BP: 122/77  Pulse: (!) 109  Resp: 18  Temp: 98.2 F (36.8 C)  SpO2: 100%     FHT: NA  Lab orders placed from triage: None

## 2024-04-13 NOTE — Telephone Encounter (Signed)
 Attempted to reach patient to advise of results. Unable to leave VM.

## 2024-04-13 NOTE — Telephone Encounter (Signed)
 Attempted to reach pt via TC to inform of HCG results and need for repeat draw on 6/4. No answer, unable to leave VM. Pt was made aware at visit yesterday that we would be calling to inform of results and next steps. However, unable to reach pt after two attempts so far.

## 2024-04-13 NOTE — MAU Provider Note (Signed)
 Abd Pain s/p MTX    S [name] is a 23 y.o. [phone] who is s/p MTX dosing on 5/22who presents to MAU today with complaint of worsening abdominal pain. Pt had appropriately down trending hCG on Day 7 (5/28).  However, states in triage that  she had increased abdominal pain starting today.  It is over the RLQ and sharp/stabbing/twisting pain.  Reports 6/10, hasn't taken anything for the pain since tylenol  5/28 w/o relief.    Pertinent items noted in HPI and remainder of comprehensive ROS otherwise negative.   O BP 122/77 (BP Location: Right Arm)   Pulse (!) 109   Temp 98.2 F (36.8 C)   Resp 18   Ht 5\' 4"  (1.626 m)   Wt 49 kg   LMP 03/01/2024 (Exact Date)   SpO2 100%   BMI 18.56 kg/m  Physical Exam Constitutional:      General: She is not in acute distress.    Appearance: She is well-developed and normal weight. She is not ill-appearing.  HENT:     Head: Normocephalic and atraumatic.  Eyes:     Extraocular Movements: Extraocular movements intact.  Cardiovascular:     Rate and Rhythm: Normal rate.  Pulmonary:     Effort: Pulmonary effort is normal. No respiratory distress.  Abdominal:     General: Abdomen is flat. There is no distension.     Palpations: Abdomen is soft.     Tenderness: There is abdominal tenderness (mild with deep palpation). There is no guarding or rebound. Negative signs include Rovsing's sign.  Skin:    General: Skin is warm and dry.  Neurological:     Mental Status: She is alert and oriented to person, place, and time.     Motor: No weakness.  Psychiatric:        Mood and Affect: Mood normal.        Behavior: Behavior normal.      MDM: MAU Course:  CBCdiff no leukocytosis, stable Hgb 13.9 CMP collected   TVUS = stable R ectopic w/o signs of rupture and no significant free fluid in pelvis   Pt appears to be similar presentation to when she was dx'ed by writer at original visit when comparing physical exams.  Stable Hgb and no evidence  of rupture on US .  Will give Oxy 5mg  given subjective worsening of pain.  Plan to f/u CMP given methotrexate use. Once tolerated, stable for d/c.   AP #R sided ectopic pregnancy s/p methotrexate - appropriately down trending hCG, recommended following up [facility], confirmed correct phone numbers in the chart - recommended Ibuprofen  600mg  TID scheduled and Oxycodone  5mg  Q6hr PRN  Discharge from MAU in stable condition with strict/usual precautions Follow up at desired OBGYN as scheduled for ongoing prenatal care  Allergies as of 04/13/2024       Reactions   Omnicef  [cefdinir ] Other (See Comments)   Dizzy, abd pains        Medication List     TAKE these medications    oxyCODONE  5 MG immediate release tablet Commonly known as: Oxy IR/ROXICODONE  Take 1 tablet (5 mg total) by mouth every 6 (six) hours as needed for severe pain (pain score 7-10).   promethazine  25 MG tablet Commonly known as: PHENERGAN  Take 1 tablet (25 mg total) by mouth every 6 (six) hours as needed for nausea or vomiting.        Stefanie Goldstein, MD 04/13/2024 6:32 PM

## 2024-04-17 ENCOUNTER — Ambulatory Visit: Payer: Self-pay | Admitting: Obstetrics and Gynecology

## 2024-04-19 ENCOUNTER — Other Ambulatory Visit

## 2024-04-19 DIAGNOSIS — O00101 Right tubal pregnancy without intrauterine pregnancy: Secondary | ICD-10-CM

## 2024-04-20 ENCOUNTER — Ambulatory Visit: Payer: Self-pay | Admitting: Obstetrics and Gynecology

## 2024-04-20 LAB — BETA HCG QUANT (REF LAB): hCG Quant: 34 m[IU]/mL

## 2024-04-26 ENCOUNTER — Encounter

## 2024-04-27 ENCOUNTER — Other Ambulatory Visit

## 2024-04-27 DIAGNOSIS — O00101 Right tubal pregnancy without intrauterine pregnancy: Secondary | ICD-10-CM | POA: Diagnosis not present

## 2024-04-28 LAB — BETA HCG QUANT (REF LAB): hCG Quant: 3 m[IU]/mL

## 2024-05-05 ENCOUNTER — Ambulatory Visit: Payer: Self-pay | Admitting: Obstetrics and Gynecology

## 2024-05-29 ENCOUNTER — Encounter: Admitting: Obstetrics and Gynecology

## 2024-08-12 ENCOUNTER — Encounter (HOSPITAL_BASED_OUTPATIENT_CLINIC_OR_DEPARTMENT_OTHER): Payer: Self-pay | Admitting: Emergency Medicine

## 2024-08-12 ENCOUNTER — Emergency Department (HOSPITAL_BASED_OUTPATIENT_CLINIC_OR_DEPARTMENT_OTHER)
Admission: EM | Admit: 2024-08-12 | Discharge: 2024-08-12 | Disposition: A | Discharge status: Home / Self Care | Attending: Emergency Medicine | Admitting: Emergency Medicine

## 2024-08-12 ENCOUNTER — Other Ambulatory Visit: Payer: Self-pay

## 2024-08-12 DIAGNOSIS — R42 Dizziness and giddiness: Secondary | ICD-10-CM | POA: Diagnosis not present

## 2024-08-12 DIAGNOSIS — R55 Syncope and collapse: Secondary | ICD-10-CM | POA: Diagnosis present

## 2024-08-12 DIAGNOSIS — F419 Anxiety disorder, unspecified: Secondary | ICD-10-CM | POA: Diagnosis not present

## 2024-08-12 DIAGNOSIS — R519 Headache, unspecified: Secondary | ICD-10-CM | POA: Insufficient documentation

## 2024-08-12 DIAGNOSIS — E876 Hypokalemia: Secondary | ICD-10-CM | POA: Insufficient documentation

## 2024-08-12 LAB — URINALYSIS, ROUTINE W REFLEX MICROSCOPIC
Bilirubin Urine: NEGATIVE
Glucose, UA: NEGATIVE mg/dL
Hgb urine dipstick: NEGATIVE
Ketones, ur: NEGATIVE mg/dL
Leukocytes,Ua: NEGATIVE
Nitrite: NEGATIVE
Protein, ur: NEGATIVE mg/dL
Specific Gravity, Urine: <1.005 — ABNORMAL LOW (ref 1.005–1.030)
pH: 7 (ref 5.0–8.0)

## 2024-08-12 LAB — COMPREHENSIVE METABOLIC PANEL WITH GFR
ALT: 13 U/L (ref 0–44)
AST: 22 U/L (ref 15–41)
Albumin: 4.9 g/dL (ref 3.5–5.0)
Alkaline Phosphatase: 60 U/L (ref 38–126)
Anion gap: 19 — ABNORMAL HIGH (ref 5–15)
BUN: <5 mg/dL — ABNORMAL LOW (ref 6–20)
CO2: 18 mmol/L — ABNORMAL LOW (ref 22–32)
Calcium: 9.9 mg/dL (ref 8.9–10.3)
Chloride: 107 mmol/L (ref 98–111)
Creatinine, Ser: 0.72 mg/dL (ref 0.44–1.00)
GFR, Estimated: >60 mL/min (ref >60)
Glucose, Bld: 104 mg/dL — ABNORMAL HIGH (ref 70–99)
Potassium: 3.2 mmol/L — ABNORMAL LOW (ref 3.5–5.1)
Sodium: 143 mmol/L (ref 135–145)
Total Bilirubin: 0.3 mg/dL (ref 0.0–1.2)
Total Protein: 7.6 g/dL (ref 6.5–8.1)

## 2024-08-12 LAB — CBC
HCT: 42.1 % (ref 36.0–46.0)
Hemoglobin: 14.5 g/dL (ref 12.0–15.0)
MCH: 31 pg (ref 26.0–34.0)
MCHC: 34.4 g/dL (ref 30.0–36.0)
MCV: 90 fL (ref 80.0–100.0)
Platelets: 309 K/uL (ref 150–400)
RBC: 4.68 MIL/uL (ref 3.87–5.11)
RDW: 12.8 % (ref 11.5–15.5)
WBC: 7.6 K/uL (ref 4.0–10.5)
nRBC: 0 % (ref 0.0–0.2)

## 2024-08-12 LAB — TROPONIN T, HIGH SENSITIVITY
Troponin T High Sensitivity: <15 ng/L (ref 0–19)
Troponin T High Sensitivity: <15 ng/L (ref 0–19)

## 2024-08-12 LAB — PREGNANCY, URINE: Preg Test, Ur: NEGATIVE

## 2024-08-12 LAB — CBG MONITORING, ED: Glucose-Capillary: 99 mg/dL (ref 70–99)

## 2024-08-12 MED ORDER — LACTATED RINGERS IV BOLUS
1000.0000 mL | Freq: Once | INTRAVENOUS | Status: AC
  Administered 2024-08-12: 1000 mL via INTRAVENOUS

## 2024-08-12 MED ORDER — KETOROLAC TROMETHAMINE 15 MG/ML IJ SOLN
15.0000 mg | Freq: Once | INTRAMUSCULAR | Status: AC
  Administered 2024-08-12: 15 mg via INTRAVENOUS
  Filled 2024-08-12: qty 1

## 2024-08-12 MED ORDER — ONDANSETRON 4 MG PO TBDP: 4.0000 mg | ORAL_TABLET | Freq: Three times a day (TID) | ORAL | 0 refills | Status: AC | PRN

## 2024-08-12 MED ORDER — HYDROXYZINE HCL 25 MG PO TABS
25.0000 mg | ORAL_TABLET | Freq: Once | ORAL | Status: AC
  Administered 2024-08-12: 25 mg via ORAL
  Filled 2024-08-12: qty 1

## 2024-08-12 MED ORDER — PROCHLORPERAZINE EDISYLATE 10 MG/2ML IJ SOLN
10.0000 mg | Freq: Once | INTRAMUSCULAR | Status: AC
Start: 2024-08-12 — End: 2024-08-12
  Administered 2024-08-12: 10 mg via INTRAVENOUS
  Filled 2024-08-12: qty 2

## 2024-08-12 MED ORDER — ONDANSETRON HCL 4 MG/2ML IJ SOLN
4.0000 mg | Freq: Once | INTRAMUSCULAR | Status: AC
  Administered 2024-08-12: 4 mg via INTRAVENOUS
  Filled 2024-08-12: qty 2

## 2024-08-12 MED ORDER — POTASSIUM CHLORIDE CRYS ER 20 MEQ PO TBCR
40.0000 meq | EXTENDED_RELEASE_TABLET | Freq: Once | ORAL | Status: AC
  Administered 2024-08-12: 40 meq via ORAL
  Filled 2024-08-12: qty 2

## 2024-08-12 NOTE — ED Notes (Signed)
 Patient unable to lay down flat for orthostatic.  States does not want to stay wanting to leave.  Wanting PIV out.  PIV removed.  Informed PA.

## 2024-08-12 NOTE — ED Provider Notes (Signed)
 [place] EMERGENCY DEPARTMENT AT [facility] Provider Note   CSN: [id number] Arrival date & time: 08/12/24  1335     Patient presents with: Near Syncope   Stefanie King is a 23 y.o. female with history of disordered eating, anxiety, presents with concern for feeling dizzy when she went to stand up this morning.  She also reported about 3 episodes of nonbloody emesis this morning.  She denies any abdominal pain, diarrhea, fever, or chills. Normal bowel movements. No chest pain or shortness of breath. She does report a frontal headache which is consistent with her normal headaches.  No changes in vision.  Denies any head trauma.  No recent antibiotic use, hospitalizations, recent travel.  Reports she is currently on her menstrual cycle.    Near Syncope       Prior to Admission medications   Medication Sig Start Date End Date Taking? Authorizing Provider  ondansetron  (ZOFRAN -ODT) 4 MG disintegrating tablet Take 1 tablet (4 mg total) by mouth every 8 (eight) hours as needed for nausea or vomiting. 08/12/24  Yes Veta Palma, PA-C  oxyCODONE  (OXY IR/ROXICODONE ) 5 MG immediate release tablet Take 1 tablet (5 mg total) by mouth every 6 (six) hours as needed for severe pain (pain score 7-10). 04/13/24   Jhonny Augustin BROCKS, MD  promethazine  (PHENERGAN ) 25 MG tablet Take 1 tablet (25 mg total) by mouth every 6 (six) hours as needed for nausea or vomiting. Patient not taking: Reported on 04/12/2024 04/03/24   Constant, Peggy, MD    Allergies: Omnicef  [cefdinir ]    Review of Systems  Cardiovascular:  Positive for near-syncope.    Updated Vital Signs BP 102/79   Pulse (!) 52   Temp 98.4 F (36.9 C) (Oral)   Resp 20   Ht 5' 3 (1.6 m)   Wt 49.9 kg   LMP 08/09/2024 (Exact Date)   SpO2 98%   BMI 19.49 kg/m   Physical Exam Vitals and nursing note reviewed.  Constitutional:      General: She is not in acute distress.    Appearance: She is well-developed.     Comments:  Appears anxious, no active vomiting  HENT:     Head: Normocephalic and atraumatic.  Eyes:     Conjunctiva/sclera: Conjunctivae normal.  Cardiovascular:     Rate and Rhythm: Normal rate and regular rhythm.     Heart sounds: No murmur heard.    Comments: 2+ radial pedal pulses bilaterally Pulmonary:     Effort: Pulmonary effort is normal. No respiratory distress.     Breath sounds: Normal breath sounds.  Abdominal:     Palpations: Abdomen is soft.     Tenderness: There is no abdominal tenderness.  Musculoskeletal:        General: No swelling.     Cervical back: Neck supple.  Skin:    General: Skin is warm and dry.     Capillary Refill: Capillary refill takes less than 2 seconds.  Neurological:     Mental Status: She is alert.     Comments: Mental status: Alert and oriented to self, place, and month  Speech: Answers questions appropriately  Cranial Nerves: III, IV, VI: EOM intact, Pupils equal round and reactive, no gaze preference or deviation, no nystagmus. V: normal sensation in V1, V2, and V3 segments bilaterally VII: smiles, puffs cheeks, raises eyebrows, and closes eyes without asymmetry.  VIII: normal hearing to speech IX, X: normal palatal elevation, no uvular deviation XI: 5/5 head turn and 5/5 shoulder  shrug bilaterally XII: midline tongue protrusion  Motor: 5/5 strength with resisted elbow flexion extension, wrist flexion extension, ankle plantarflexion and dorsiflexion bilaterally   Sensory: Intact sensation in upper and lower extremity bilaterally   Gait: Normal   Psychiatric:        Mood and Affect: Mood normal.     (all labs ordered are listed, but only abnormal results are displayed) Labs Reviewed  COMPREHENSIVE METABOLIC PANEL WITH GFR - Abnormal; Notable for the following components:      Result Value   Potassium 3.2 (*)    CO2 18 (*)    Glucose, Bld 104 (*)    BUN <5 (*)    Anion gap 19 (*)    All other components within normal limits   URINALYSIS, ROUTINE W REFLEX MICROSCOPIC - Abnormal; Notable for the following components:   Color, Urine COLORLESS (*)    Specific Gravity, Urine <1.005 (*)    All other components within normal limits  CBC  PREGNANCY, URINE  CBG MONITORING, ED  TROPONIN T, HIGH SENSITIVITY  TROPONIN T, HIGH SENSITIVITY    EKG: EKG Interpretation Date/Time:  Saturday August 12 2024 13:47:42 EDT Ventricular Rate:  80 PR Interval:  152 QRS Duration:  80 QT Interval:  372 QTC Calculation: 429 R Axis:   28  Text Interpretation: Normal sinus rhythm with sinus arrhythmia Septal infarct , age undetermined Abnormal ECG When compared with ECG of 28-Aug-2021 02:46, PREVIOUS ECG IS PRESENT No significant change since last tracing Confirmed by Zackowski, Scott [phone]) on 08/12/2024 2:33:09 PM  Radiology: No results found.   Procedures   Medications Ordered in the ED  ondansetron  (ZOFRAN ) injection 4 mg (4 mg Intravenous Given 08/12/24 1419)  lactated ringers  bolus 1,000 mL (0 mLs Intravenous Stopped 08/12/24 1658)  hydrOXYzine  (ATARAX ) tablet 25 mg (25 mg Oral Given 08/12/24 1420)  potassium chloride  SA (KLOR-CON  M) CR tablet 40 mEq (40 mEq Oral Given 08/12/24 1448)  ketorolac  (TORADOL ) 15 MG/ML injection 15 mg (15 mg Intravenous Given 08/12/24 1623)  prochlorperazine (COMPAZINE) injection 10 mg (10 mg Intravenous Given 08/12/24 1623)    Clinical Course as of 08/12/24 1706  Sat Aug 12, 2024  1613 Patient reevaluated and is tolerating crackers and water bedside [AF]    Clinical Course User Index [AF] Veta Palma, PA-C                                 Medical Decision Making Amount and/or Complexity of Data Reviewed Labs: ordered.  Risk Prescription drug management.     Differential diagnosis includes but is not limited to Acute cholecystitis, cholelithiasis, cholangitis, choledocholithiasis, peptic ulcer, gastritis, gastroenteritis, appendicitis, IBS, IBD, DKA, nephrolithiasis, UTI,  pyelonephritis, pancreatitis, diverticulitis, mesenteric ischemia, abdominal aortic aneurysm, small bowel obstruction, volvulus, ovarian torsion and pregnancy related concerns in females of childbearing age, anxiety, electrolyte abnormality, ACS, orthostatic hypertension, hypoglycemia   ED Course:  Upon initial evaluation, patient is anxious appearing, but no acute distress.  Stable vitals.  No active vomiting.  Lungs good auscultation bilaterally.  Regular rate and rhythm on cardiac auscultation.  Abdomen soft and nontender.  Labs Ordered: I Ordered, and personally interpreted labs.  The pertinent results include:   CBC within normal limits CMP with hypokalemia at 3.2.  Anion gap at 19.  No elevations in LFTs or creatinine Urinalysis without any signs of infection Pregnancy negative Initial and repeat troponin under 15   Cardiac Monitoring: / EKG:  The patient was maintained on a cardiac monitor.  I personally viewed and interpreted the cardiac monitored which showed an underlying rhythm of: Normal sinus rhythm   Medications Given: 40 mEq KCl tablet LR bolus Hydroxyzine  Zofran  Toradol  Compazine  Upon re-evaluation, patient remains well-appearing with stable vitals.  She request discharge home, reports that she is feeling much better.  She has ambulated back and forth from the bathroom without difficulty or feelings of presyncope.  No chest pain, shortness of breath, initial and repeat troponin less than 15, no ST elevations on EKG, low concern for ACS.  EKG with normal sinus rhythm, no signs of arrhythmia.  She was p.o. challenged with crackers and water and tolerated this well.  Has not had any further episodes of emesis here in the emergency room.  Abdomen is soft and nontender, she still reports normal bowel movements, and low concern for SBO.  Low concern for other acute intra-abdominal abnormality such as diverticulitis, appendicitis, since abdomen is soft and nontender, labs without  any leukocytosis, normal vitals.  Urinalysis without signs of infection.  Pregnancy test is negative.  She reports some dizziness when trying to stand earlier today, but this has since resolved after receiving fluids and medications.  She did not have any neurologic deficits on initial exam, no concern for TIA, CVA.  No nystagmus or reproducible dizziness with head movements, lower concern for vertigo.  Question if she could have been slightly dehydrated that led to some component of orthostatic hypotension when standing earlier today. Patient declines orthostatic vitals, but has been ambulating without difficulty.  Stable and appropriate for discharge home    Impression: Hypokalemia Dizziness, resolved  Disposition:  The patient was discharged home with instructions to continue to keep well-hydrated at home.  Zofran  as needed for nausea.  Follow-up with PCP if symptoms not improved within the next 2 days. Return precautions given.     This chart was dictated using voice recognition software, Dragon. Despite the best efforts of this provider to proofread and correct errors, errors may still occur which can change documentation meaning.       Final diagnoses:  Hypokalemia  Dizziness    ED Discharge Orders          Ordered    ondansetron  (ZOFRAN -ODT) 4 MG disintegrating tablet  Every 8 hours PRN        08/12/24 1701               Veta Palma, PA-C 08/12/24 1706    Yolande Lamar BROCKS, MD 08/16/24 [phone]

## 2024-08-12 NOTE — ED Notes (Signed)
Given crackers and drink for PO challenge.  

## 2024-08-12 NOTE — Discharge Instructions (Addendum)
 Your workup is reassuring today.  Your kidney and liver labs were normal.  Your blood counts were normal.  Your urine did not show any signs of infection.  Pregnancy test was negative. Your heart test was normal, no signs of a heart attack or abnormal heart rhythm.   You were found to have a slightly low potassium level on your labs today.  You are given a supplement here today to help bring this back up.  Please increase your dietary intake of potassium with foods such as avocados, potatoes, bananas, spinach, salmon. Please have your PCP monitor this value.   Please keep well-hydrated at home with electrolyte drink such as Pedialyte if you are continuing to have vomiting or diarrhea.   You have been prescribed Zofran  (ondansetron ) for nausea and vomiting. You may take this every 8 hours as needed for nausea and vomiting. This medication dissolves under the tongue. You do not need to swallow it.  Please follow-up with your PCP if you have not had improvement in symptoms within the next 2 days.  Please return to the ER if you have any abdominal pain, persistent vomiting, unexplained fevers, episodes of losing consciousness, any other new or concerning symptoms

## 2024-08-12 NOTE — ED Triage Notes (Signed)
 Pt via POV c/o near syncope and trembling since this morning with multiple episodes of emesis today. Pt appears pale.

## 2024-10-04 DIAGNOSIS — R1031 Right lower quadrant pain: Secondary | ICD-10-CM | POA: Diagnosis not present

## 2024-10-04 DIAGNOSIS — R0602 Shortness of breath: Secondary | ICD-10-CM | POA: Diagnosis not present

## 2024-10-04 DIAGNOSIS — R1084 Generalized abdominal pain: Secondary | ICD-10-CM | POA: Diagnosis not present

## 2024-10-04 DIAGNOSIS — Z0000 Encounter for general adult medical examination without abnormal findings: Secondary | ICD-10-CM | POA: Diagnosis not present

## 2024-10-04 DIAGNOSIS — Z13220 Encounter for screening for lipoid disorders: Secondary | ICD-10-CM | POA: Diagnosis not present

## 2024-10-04 DIAGNOSIS — F419 Anxiety disorder, unspecified: Secondary | ICD-10-CM | POA: Diagnosis not present

## 2024-10-04 DIAGNOSIS — Z131 Encounter for screening for diabetes mellitus: Secondary | ICD-10-CM | POA: Diagnosis not present

## 2024-10-16 DIAGNOSIS — R1084 Generalized abdominal pain: Secondary | ICD-10-CM | POA: Diagnosis not present

## 2024-10-16 DIAGNOSIS — F419 Anxiety disorder, unspecified: Secondary | ICD-10-CM | POA: Diagnosis not present

## 2024-11-07 DIAGNOSIS — J00 Acute nasopharyngitis [common cold]: Secondary | ICD-10-CM | POA: Diagnosis not present

## 2024-11-07 DIAGNOSIS — R059 Cough, unspecified: Secondary | ICD-10-CM | POA: Diagnosis not present

## 2024-11-07 DIAGNOSIS — Z681 Body mass index (BMI) 19 or less, adult: Secondary | ICD-10-CM | POA: Diagnosis not present

## 2024-11-17 ENCOUNTER — Emergency Department (HOSPITAL_COMMUNITY)

## 2024-11-17 ENCOUNTER — Emergency Department (HOSPITAL_COMMUNITY)
Admission: EM | Admit: 2024-11-17 | Discharge: 2024-11-17 | Disposition: A | Discharge status: Home / Self Care | Attending: Emergency Medicine | Admitting: Emergency Medicine

## 2024-11-17 DIAGNOSIS — R002 Palpitations: Secondary | ICD-10-CM | POA: Insufficient documentation

## 2024-11-17 DIAGNOSIS — R091 Pleurisy: Secondary | ICD-10-CM | POA: Diagnosis present

## 2024-11-17 DIAGNOSIS — R059 Cough, unspecified: Secondary | ICD-10-CM | POA: Diagnosis not present

## 2024-11-17 DIAGNOSIS — I10 Essential (primary) hypertension: Secondary | ICD-10-CM | POA: Diagnosis not present

## 2024-11-17 LAB — CBC WITH DIFFERENTIAL/PLATELET
Abs Immature Granulocytes: 0.06 K/uL (ref 0.00–0.07)
Basophils Absolute: 0.1 K/uL (ref 0.0–0.1)
Basophils Relative: 1 %
Eosinophils Absolute: 0.2 K/uL (ref 0.0–0.5)
Eosinophils Relative: 2 %
HCT: 43.4 % (ref 36.0–46.0)
Hemoglobin: 14.6 g/dL (ref 12.0–15.0)
Immature Granulocytes: 1 %
Lymphocytes Relative: 33 %
Lymphs Abs: 2.1 K/uL (ref 0.7–4.0)
MCH: 31 pg (ref 26.0–34.0)
MCHC: 33.6 g/dL (ref 30.0–36.0)
MCV: 92.1 fL (ref 80.0–100.0)
Monocytes Absolute: 0.5 K/uL (ref 0.1–1.0)
Monocytes Relative: 7 %
Neutro Abs: 3.6 K/uL (ref 1.7–7.7)
Neutrophils Relative %: 56 %
Platelets: 385 K/uL (ref 150–400)
RBC: 4.71 MIL/uL (ref 3.87–5.11)
RDW: 12 % (ref 11.5–15.5)
WBC: 6.5 K/uL (ref 4.0–10.5)
nRBC: 0 % (ref 0.0–0.2)

## 2024-11-17 LAB — COMPREHENSIVE METABOLIC PANEL WITH GFR
ALT: 33 U/L (ref 0–44)
AST: 25 U/L (ref 15–41)
Albumin: 4.5 g/dL (ref 3.5–5.0)
Alkaline Phosphatase: 62 U/L (ref 38–126)
Anion gap: 10 (ref 5–15)
BUN: 9 mg/dL (ref 6–20)
CO2: 26 mmol/L (ref 22–32)
Calcium: 9.5 mg/dL (ref 8.9–10.3)
Chloride: 105 mmol/L (ref 98–111)
Creatinine, Ser: 0.69 mg/dL (ref 0.44–1.00)
GFR, Estimated: >60 mL/min
Glucose, Bld: 94 mg/dL (ref 70–99)
Potassium: 3.7 mmol/L (ref 3.5–5.1)
Sodium: 141 mmol/L (ref 135–145)
Total Bilirubin: 0.4 mg/dL (ref 0.0–1.2)
Total Protein: 7.7 g/dL (ref 6.5–8.1)

## 2024-11-17 LAB — HCG, SERUM, QUALITATIVE: Preg, Serum: NEGATIVE

## 2024-11-17 LAB — TROPONIN T, HIGH SENSITIVITY: Troponin T High Sensitivity: <15 ng/L (ref 0–19)

## 2024-11-17 LAB — D-DIMER, QUANTITATIVE: D-Dimer, Quant: 0.33 ug{FEU}/mL (ref 0.00–0.50)

## 2024-11-17 MED ORDER — KETOROLAC TROMETHAMINE 15 MG/ML IJ SOLN
10.0000 mg | Freq: Once | INTRAMUSCULAR | Status: AC
  Administered 2024-11-17: 10 mg via INTRAVENOUS
  Filled 2024-11-17: qty 1

## 2024-11-17 NOTE — ED Provider Notes (Signed)
 " [place] EMERGENCY DEPARTMENT AT [facility] Provider Note  CSN: [id number] Arrival date & time: 11/17/24 1331  Chief Complaint(s) Palpitations  HPI Stefanie King is a 24 y.o. female who is here today for pleuritic chest pain and cough.  She reports that she had flu last week, and this week she has been having pain with deep inspiration.  She also reports having palpitations and cough.  Patient does vape.   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 Authorizing Provider  ondansetron  (ZOFRAN -ODT) 4 MG disintegrating tablet Take 1 tablet (4 mg total) by mouth every 8 (eight) hours as needed for nausea or vomiting. 08/12/24   Veta Palma, PA-C  oxyCODONE  (OXY IR/ROXICODONE ) 5 MG immediate release tablet Take 1 tablet (5 mg total) by mouth every 6 (six) hours as needed for severe pain (pain score 7-10). 04/13/24   Jhonny Augustin BROCKS, MD  promethazine  (PHENERGAN ) 25 MG tablet Take 1 tablet (25 mg total) by mouth every 6 (six) hours as needed for nausea or vomiting. Patient not taking: Reported on 04/12/2024 04/03/24   Constant, Winton, MD                                                                                                                                     Past Surgical History Past Surgical History:  Procedure Laterality Date   CHOLECYSTECTOMY N/A 10/17/2014   Procedure: LAPAROSCOPIC CHOLECYSTECTOMY WITHOUT INTRAOPERATIVE CHOLANGIOGRAM;  Surgeon: CHRISTELLA. Julietta Millman, MD;  Location: [facility] OR;  Service: Pediatrics;  Laterality: N/A;  laparoscopic cholecystectomy   CHOLECYSTECTOMY  2015   DENTAL SURGERY     Family History Family History  Problem Relation Age of Onset   Alcohol abuse Father    Hypertension Father    Cancer Paternal Grandmother    Asthma Neg Hx    Depression Neg Hx    Diabetes Neg Hx    Drug abuse Neg Hx    Early death Neg Hx    Hearing loss Neg Hx    Heart disease Neg Hx    Hyperlipidemia Neg Hx    Kidney disease Neg Hx    Mental illness Neg Hx    Mental retardation Neg Hx  Stroke Neg Hx     Social History Social History[1] Allergies Omnicef  [cefdinir ]  Review of Systems Review of Systems  Physical Exam Vital Signs  I have reviewed the triage vital signs BP 125/83   Pulse 83   Temp 98.2 F (36.8 C) (Oral)   Resp 16   LMP 11/15/2024 (Exact Date)   SpO2 100%   Physical Exam Vitals and nursing note reviewed.  Constitutional:      Appearance: She is not toxic-appearing.  HENT:     Head: Normocephalic and atraumatic.  Eyes:     Pupils: Pupils are equal, round, and reactive to light.  Cardiovascular:     Rate and Rhythm: Normal rate and regular rhythm.     Heart sounds: No murmur heard. Pulmonary:     Effort: Pulmonary effort is normal. No respiratory distress.     Breath sounds: No wheezing or rales.  Abdominal:     General: Abdomen is flat.  Musculoskeletal:        General: Normal range of motion.     Cervical back: Normal range of motion.     Right lower leg: No edema.     Left lower leg: No edema.  Skin:    General: Skin is warm.  Neurological:     General: No focal deficit present.     Mental Status: She is alert.     ED Results and Treatments Labs (all labs ordered are  listed, but only abnormal results are displayed) Labs Reviewed  CBC WITH DIFFERENTIAL/PLATELET  COMPREHENSIVE METABOLIC PANEL WITH GFR  D-DIMER, QUANTITATIVE  HCG, SERUM, QUALITATIVE  TROPONIN T, HIGH SENSITIVITY                                                                                                                          Radiology DG Chest 1 View Result Date: 11/17/2024 EXAM: 1 VIEW(S) XRAY OF THE CHEST 11/17/2024 06:00:00 PM COMPARISON: chest x-ray report 02/27/2022 CLINICAL HISTORY: cough FINDINGS: LUNGS AND PLEURA: No focal pulmonary opacity. No pleural effusion. No pneumothorax. HEART AND MEDIASTINUM: No acute abnormality of the cardiac and mediastinal silhouettes. BONES AND SOFT TISSUES: No acute osseous abnormality. IMPRESSION: 1. No acute cardiopulmonary abnormality. Electronically signed by: Morgane Naveau MD 11/17/2024 07:00 PM EST RP Workstation: [id number]    Pertinent labs & imaging results that were available during my care of the patient were reviewed by me and considered in my medical decision making (see MDM for details).  Medications Ordered in ED Medications  ketorolac  (TORADOL ) 15 MG/ML injection 10 mg (has no administration in time range)  Procedures Procedures  (including critical care time)  Medical Decision Making / ED Course   This patient presents to the ED for concern of pleuritic chest pain and palpitations, this involves an extensive number of treatment options, and is a complaint that carries with it a high risk of complications and morbidity.  The differential diagnosis includes pleurisy, PVCs, PACs, less likely ACS, consider PE, consider pneumonia.  MDM: In the emergency room, patient with normal vital signs.  Heart rate in the 80s.  She has clear lung sounds, normal heart sounds.  Reviewed patient's EKG, which does  show a right axis deviation.  She does have Q waves in V2 and V3, however these are present as far back as 2020.  With the axis deviation, will obtain D-dimer on the patient.  She does not take any oral contraceptives, no recent travel, no swelling of the legs.  Initial troponin negative.  Reassessment 7:10 PM-D-dimer negative.  Chest x-ray negative.  Will provide her some Toradol  for presumed pleurisy.  Patient with a heart score of 1.  She is appropriate for discharge.   Additional history obtained:  -External records from outside source obtained and reviewed including: Chart review including previous notes, labs, imaging, consultation notes   Lab Tests: -I ordered, reviewed, and interpreted labs.   The pertinent results include:   Labs Reviewed  CBC WITH DIFFERENTIAL/PLATELET  COMPREHENSIVE METABOLIC PANEL WITH GFR  D-DIMER, QUANTITATIVE  HCG, SERUM, QUALITATIVE  TROPONIN T, HIGH SENSITIVITY      EKG normal sinus rhythm, anterior Q waves, unchanged from prior.  EKG Interpretation Date/Time:  Friday November 17 2024 13:45:49 EST Ventricular Rate:  78 PR Interval:  153 QRS Duration:  91 QT Interval:  360 QTC Calculation: 410 R Axis:   71  Text Interpretation: Sinus rhythm Low voltage with right axis deviation Abnormal Q suggests anterior infarct Confirmed by Mannie Pac [phone]) on 11/17/2024 6:16:30 PM         Imaging Studies ordered: I ordered imaging studies including chest x-ray I independently visualized and interpreted imaging. I agree with the radiologist interpretation   Medicines ordered and prescription drug management: Meds ordered this encounter  Medications   ketorolac  (TORADOL ) 15 MG/ML injection 10 mg    -I have reviewed the patients home medicines and have made adjustments as needed   Cardiac Monitoring: The patient was maintained on a cardiac monitor.  I personally viewed and interpreted the cardiac monitored which showed an underlying rhythm  of: Normal sinus rhythm  Social Determinants of Health:  Factors impacting patients care include: Lack of access to primary care   Reevaluation: After the interventions noted above, I reevaluated the patient and found that they have :improved  Co morbidities that complicate the patient evaluation  Past Medical History:  Diagnosis Date   Anxiety    Disordered eating 12/25/2015   Hypertension    Migraines    Pregnancy induced hypertension       Dispostion: I considered admission for this patient, however with reassuring workup and low risk factors she is appropriate for discharge.  I counseled this patient on smoking cessation.     Final Clinical Impression(s) / ED Diagnoses Final diagnoses:  Pleurisy     @PCDICTATION @     [1]  Social History Tobacco Use   Smoking status: Never   Smokeless tobacco: Never   Tobacco comments:    last marijuana smoked 06-25-18  Vaping Use   Vaping status: Every Day  Substance Use Topics  Alcohol use: No    Alcohol/week: 0.0 standard drinks of alcohol   Drug use: Not Currently    Types: Marijuana    Comment: 01-2021     Mannie Pac T, DO 11/17/24 1913  "

## 2024-11-17 NOTE — ED Triage Notes (Signed)
 C/o CP with coughing, palpitations, lightheadedness, and pain behind right shoulder. Was lying in bed when all of this started.

## 2024-11-17 NOTE — Discharge Instructions (Signed)
 Your chest x-ray was normal.  Your EKG is unchanged.  Your blood work is overall normal.  Your test did not show blood clots.  Your pregnancy test is negative.  I think that you have some inflammation of your lungs likely related to recent flu.  I also recommend stopping vaping.  You may take Motrin  and Tylenol  at home.  Follow-up with your primary care doctor.

## 2024-11-29 ENCOUNTER — Emergency Department (HOSPITAL_COMMUNITY)

## 2024-11-29 ENCOUNTER — Other Ambulatory Visit: Payer: Self-pay

## 2024-11-29 ENCOUNTER — Encounter (HOSPITAL_COMMUNITY): Payer: Self-pay | Admitting: *Deleted

## 2024-11-29 ENCOUNTER — Emergency Department (HOSPITAL_COMMUNITY): Admission: EM | Admit: 2024-11-29 | Discharge: 2024-11-29 | Disposition: A | Discharge status: Home / Self Care

## 2024-11-29 DIAGNOSIS — R1030 Lower abdominal pain, unspecified: Secondary | ICD-10-CM | POA: Diagnosis present

## 2024-11-29 DIAGNOSIS — N200 Calculus of kidney: Secondary | ICD-10-CM | POA: Diagnosis not present

## 2024-11-29 LAB — CBC WITH DIFFERENTIAL/PLATELET
Abs Immature Granulocytes: 0.03 K/uL (ref 0.00–0.07)
Basophils Absolute: 0.1 K/uL (ref 0.0–0.1)
Basophils Relative: 1 %
Eosinophils Absolute: 0.2 K/uL (ref 0.0–0.5)
Eosinophils Relative: 2 %
HCT: 39.2 % (ref 36.0–46.0)
Hemoglobin: 13.3 g/dL (ref 12.0–15.0)
Immature Granulocytes: 0 %
Lymphocytes Relative: 33 %
Lymphs Abs: 2.4 K/uL (ref 0.7–4.0)
MCH: 31.6 pg (ref 26.0–34.0)
MCHC: 33.9 g/dL (ref 30.0–36.0)
MCV: 93.1 fL (ref 80.0–100.0)
Monocytes Absolute: 0.6 K/uL (ref 0.1–1.0)
Monocytes Relative: 8 %
Neutro Abs: 4.1 K/uL (ref 1.7–7.7)
Neutrophils Relative %: 56 %
Platelets: 268 K/uL (ref 150–400)
RBC: 4.21 MIL/uL (ref 3.87–5.11)
RDW: 12.7 % (ref 11.5–15.5)
WBC: 7.3 K/uL (ref 4.0–10.5)
nRBC: 0 % (ref 0.0–0.2)

## 2024-11-29 LAB — URINALYSIS, ROUTINE W REFLEX MICROSCOPIC
Bilirubin Urine: NEGATIVE
Glucose, UA: NEGATIVE mg/dL
Hgb urine dipstick: NEGATIVE
Ketones, ur: NEGATIVE mg/dL
Leukocytes,Ua: NEGATIVE
Nitrite: NEGATIVE
Protein, ur: NEGATIVE mg/dL
Specific Gravity, Urine: 1.006 (ref 1.005–1.030)
pH: 6 (ref 5.0–8.0)

## 2024-11-29 LAB — COMPREHENSIVE METABOLIC PANEL WITH GFR
ALT: 17 U/L (ref 0–44)
AST: 19 U/L (ref 15–41)
Albumin: 4.5 g/dL (ref 3.5–5.0)
Alkaline Phosphatase: 58 U/L (ref 38–126)
Anion gap: 11 (ref 5–15)
BUN: 5 mg/dL — ABNORMAL LOW (ref 6–20)
CO2: 24 mmol/L (ref 22–32)
Calcium: 9.1 mg/dL (ref 8.9–10.3)
Chloride: 105 mmol/L (ref 98–111)
Creatinine, Ser: 0.62 mg/dL (ref 0.44–1.00)
GFR, Estimated: >60 mL/min
Glucose, Bld: 89 mg/dL (ref 70–99)
Potassium: 3.6 mmol/L (ref 3.5–5.1)
Sodium: 139 mmol/L (ref 135–145)
Total Bilirubin: 0.4 mg/dL (ref 0.0–1.2)
Total Protein: 7.1 g/dL (ref 6.5–8.1)

## 2024-11-29 LAB — POC URINE PREG, ED: Preg Test, Ur: NEGATIVE

## 2024-11-29 LAB — LIPASE, BLOOD: Lipase: 23 U/L (ref 11–51)

## 2024-11-29 LAB — HCG, SERUM, QUALITATIVE: Preg, Serum: NEGATIVE

## 2024-11-29 MED ORDER — OXYCODONE-ACETAMINOPHEN 5-325 MG PO TABS: 1.0000 | ORAL_TABLET | Freq: Four times a day (QID) | ORAL | 0 refills | Status: AC | PRN

## 2024-11-29 MED ORDER — ONDANSETRON 4 MG PO TBDP: 4.0000 mg | ORAL_TABLET | Freq: Three times a day (TID) | ORAL | 0 refills | Status: AC | PRN

## 2024-11-29 NOTE — ED Notes (Signed)
 Pt back from CT

## 2024-11-29 NOTE — Discharge Instructions (Signed)
 Take Tylenol  alternate with ibuprofen  for pain.  You may take the nausea medications as needed.  If you still have uncontrolled pain you may take the Percocet.  Please follow-up with your primary doctor.  Return for fevers, chills, severe pain, uncontrolled nausea, vomiting, inability to urinate or you develop any new or worsening symptoms that are concerning to you.

## 2024-11-29 NOTE — ED Provider Notes (Signed)
 " [place] [facility] AT [facility] Provider Note   CSN: [id number] Arrival date & time: 11/29/24  1513     Patient presents with: Abdominal Pain   Stefanie King is a 24 y.o. female.   24 year old female presenting emergency department for lower abdominal pain.  Symptoms increasing over the past week, constant, but varying in intensity.  Associated with nausea and vomiting.  Last menstrual period was last week.  Feels similar to prior ectopic pregnancy.  Denies dysuria, vaginal discharge.  Having normal bowel movements otherwise.   Abdominal Pain      Prior to Admission medications  Medication Sig Start Date End Date Taking? Authorizing Provider  ondansetron  (ZOFRAN -ODT) 4 MG disintegrating tablet Take 1 tablet (4 mg total) by mouth every 8 (eight) hours as needed for nausea or vomiting. 11/29/24  Yes Neysa Caron PARAS, DO  oxyCODONE -acetaminophen  (PERCOCET/ROXICET) 5-325 MG tablet Take 1 tablet by mouth every 6 (six) hours as needed for severe pain (pain score 7-10). 11/29/24  Yes Neysa Caron PARAS, DO  ondansetron  (ZOFRAN -ODT) 4 MG disintegrating tablet Take 1 tablet (4 mg total) by mouth every 8 (eight) hours as needed for nausea or vomiting. 08/12/24   Veta Palma, PA-C  oxyCODONE  (OXY IR/ROXICODONE ) 5 MG immediate release tablet Take 1 tablet (5 mg total) by mouth every 6 (six) hours as needed for severe pain (pain score 7-10). 04/13/24   Jhonny Augustin BROCKS, MD  promethazine  (PHENERGAN ) 25 MG tablet Take 1 tablet (25 mg total) by mouth every 6 (six) hours as needed for nausea or vomiting. Patient not taking: Reported on 04/12/2024 04/03/24   Constant, Peggy, MD    Allergies: Omnicef  [cefdinir ]    Review of Systems  Gastrointestinal:  Positive for abdominal pain.    Updated Vital Signs BP 101/69 (BP Location: Right Arm)   Pulse 67   Temp 97.9 F (36.6 C)   Resp 17   Ht 5' 3 (1.6 m)   Wt 49.9 kg   LMP 11/22/2024 (Exact Date)   SpO2 100%   BMI 19.49  kg/m   Physical Exam Vitals and nursing note reviewed.  Constitutional:      General: She is not in acute distress.    Appearance: She is not toxic-appearing.  HENT:     Head: Normocephalic.  Cardiovascular:     Rate and Rhythm: Normal rate and regular rhythm.  Pulmonary:     Effort: Pulmonary effort is normal.     Breath sounds: Normal breath sounds.  Abdominal:     General: Abdomen is flat.     Palpations: Abdomen is soft.     Tenderness: There is abdominal tenderness in the right lower quadrant, suprapubic area and left lower quadrant. There is no guarding or rebound.  Skin:    General: Skin is warm.     Capillary Refill: Capillary refill takes less than 2 seconds.  Neurological:     Mental Status: She is alert and oriented to person, place, and time.  Psychiatric:        Mood and Affect: Mood normal.        Behavior: Behavior normal.     (all labs ordered are listed, but only abnormal results are displayed) Labs Reviewed  COMPREHENSIVE METABOLIC PANEL WITH GFR - Abnormal; Notable for the following components:      Result Value   BUN 5 (*)    All other components within normal limits  CBC WITH DIFFERENTIAL/PLATELET  LIPASE, BLOOD  HCG, SERUM, QUALITATIVE  URINALYSIS, ROUTINE W REFLEX MICROSCOPIC  POC URINE PREG, ED    EKG: None  Radiology: CT Renal Stone Study Result Date: 11/29/2024 EXAM: CT ABDOMEN AND PELVIS WITHOUT CONTRAST 11/29/2024 06:40:13 PM TECHNIQUE: CT of the abdomen and pelvis was performed without the administration of intravenous contrast. Multiplanar reformatted images are provided for review. Automated exposure control, iterative reconstruction, and/or weight-based adjustment of the mA/kV was utilized to reduce the radiation dose to as low as reasonably achievable. COMPARISON: None available. CLINICAL HISTORY: Abdominal/flank pain, stone suspected. FINDINGS: LOWER CHEST: No acute abnormality. LIVER: The liver is unremarkable. GALLBLADDER AND BILE  DUCTS: Status post cholecystectomy. No biliary ductal dilatation. SPLEEN: No acute abnormality. PANCREAS: No acute abnormality. ADRENAL GLANDS: No acute abnormality. KIDNEYS, URETERS AND BLADDER: 2 mm calcified stone along the the expected region of the distal right ureter may represent a nonobstructive nephrolithiasis. No left ureterolithiasis. No hydroureter bilaterally. No nephrolithiasis bilaterally. No hydronephrosis. No perinephric or periureteral stranding. Urinary bladder is unremarkable. GI AND BOWEL: Stomach demonstrates no acute abnormality. No small or large bowel thickening or dilatation. APPENDIX: The appendix is unremarkable. PERITONEUM AND RETROPERITONEUM: No ascites. No free air. VASCULATURE: Aorta is normal in caliber. LYMPH NODES: No lymphadenopathy. REPRODUCTIVE ORGANS: Uterus is unremarkable. There is no adnexal mass. BONES AND SOFT TISSUES: No acute osseous abnormality. No focal soft tissue abnormality. IMPRESSION: 1. Possible nonobstructive 2 mm right distal ureteral calculus. 2. Status post cholecystectomy. Electronically signed by: Morgane Naveau MD 11/29/2024 06:56 PM EST RP Workstation: [id number]   US  PELVIC COMPLETE W TRANSVAGINAL AND TORSION R/O Result Date: 11/29/2024 CLINICAL DATA:  Abdominal pain. History of right-sided tubal pregnancy 2025. EXAM: TRANSABDOMINAL AND TRANSVAGINAL ULTRASOUND OF PELVIS DOPPLER ULTRASOUND OF OVARIES TECHNIQUE: Both transabdominal and transvaginal ultrasound examinations of the pelvis were performed. Transabdominal technique was performed for global imaging of the pelvis including uterus, ovaries, adnexal regions, and pelvic cul-de-sac. It was necessary to proceed with endovaginal exam following the transabdominal exam to visualize the endometrium and ovaries. Color and duplex Doppler ultrasound was utilized to evaluate blood flow to the ovaries. COMPARISON:  04/13/2024 FINDINGS: Uterus Measurements: 7.5 x 4.0 x 4.5 cm = volume: 71 mL. No fibroids or  other mass visualized. Endometrium Thickness: 10 mm.  No focal abnormality visualized. Right ovary Measurements: 3.5 x 1.8 x 2.5 cm = volume:  8.6 mL. Normal appearance/no adnexal mass. Doppler: There is normal vascularity on color doppler examination. Spectral doppler arterial and venous waveforms are normal. Left ovary Measurements: 3.5 x 2.2 x 2.6 cm = volume: 0.3 mL. Normal appearance/no adnexal mass. Doppler: There is normal vascularity on color doppler examination. Spectral doppler arterial and venous waveforms are normal. Other findings Trace free fluid adjacent the left ovary. IMPRESSION: Normal pelvic ultrasound. Electronically Signed   By: Toribio Agreste M.D.   On: 11/29/2024 16:50     Procedures   Medications Ordered in the ED - No data to display  Clinical Course as of 11/29/24 2336  Wed Nov 29, 2024  1903 CT Renal Stone Study IMPRESSION: 1. Possible nonobstructive 2 mm right distal ureteral calculus. 2. Status post cholecystectomy.  Electronically signed by: Morgane Naveau MD 11/29/2024 06:56 PM EST RP Workstation: [id number]   [TY]  2336 Patient made aware of CT scan findings.  Will give short course of pain and nausea medications.  Discussed PCP follow-up.  Return precautions given. [TY]    Clinical Course User Index [TY] Neysa Caron PARAS, DO  Medical Decision Making Is a 24 year old female with history of migraines anxiety hypertension ectopic pregnancy presenting the emergency department with lower abdominal pain.  Ultrasound negative for ectopic or torsion.  Labs no leukocytosis to suggest infectious process.  No anemia.  No significant metabolic derangements.  No transaminitis or elevated bilirubin to suggest hepatobiliary disease.  Lipase is normal.  UA without evidence of urinary tract infection.  Per chart review does have a history of prior kidney stone.  Will get CT scan to evaluate.  See ED course for further MDM and final  disposition.  Amount and/or Complexity of Data Reviewed Radiology: ordered. Decision-making details documented in ED Course.  Risk Prescription drug management.       Final diagnoses:  Kidney stone    ED Discharge Orders          Ordered    ondansetron  (ZOFRAN -ODT) 4 MG disintegrating tablet  Every 8 hours PRN        11/29/24 1908    oxyCODONE -acetaminophen  (PERCOCET/ROXICET) 5-325 MG tablet  Every 6 hours PRN        11/29/24 1908               Neysa Caron PARAS, DO 11/29/24 2336  "

## 2024-11-29 NOTE — ED Triage Notes (Signed)
 Pt to er, pt states that she is here for some abd pain, states that it feels similar to the ectopic pregnancy that she had in the past.  Pt states that she has been having the pain for the past week, states that heat and motrin  helps a little bit, but the pain is still there.

## 2024-11-29 NOTE — ED Provider Triage Note (Signed)
 Emergency Medicine Provider Triage Evaluation Note  Stefanie King , a 24 y.o. female  was evaluated in triage.  Pt complains of lower abdominal pain and suprapubic pain increasing for the past week.  Notes had a right sided ectopic pregnancy last summer and pain feels similar.  Notes menstrual cycle was last week and was normal.  Notes associated nausea and occasional vomiting.  Denies fevers, vaginal discharge, bleeding/spotting.  No previous abdominal surgeries.  Review of Systems  Positive: Abdominal pain, nausea Negative: Fevers, urinary symptoms, vaginal discharge/bleeding  Physical Exam  BP 98/67 (BP Location: Right Arm)   Pulse 69   Temp (!) 97.5 F (36.4 C)   Resp 19   Ht 5' 3 (1.6 m)   Wt 49.9 kg   LMP 11/22/2024 (Exact Date)   SpO2 99%   BMI 19.49 kg/m  Gen:   Awake, no distress   Resp:  Normal effort  MSK:   Moves extremities without difficulty  Other:  Suprapubic and bilateral lower abdominal pain over the ovary area  Medical Decision Making  Medically screening exam initiated at 3:29 PM.  Appropriate orders placed.  Stefanie King was informed that the remainder of the evaluation will be completed by another provider, this initial triage assessment does not replace that evaluation, and the importance of remaining in the ED until their evaluation is complete.     Stefanie King, [place] 11/29/24 1531
